# Patient Record
Sex: Female | Born: 1985 | Race: White | Hispanic: No | Marital: Married | Smoking: Never smoker
Health system: Southern US, Community
[De-identification: ages and names within clinical notes are randomized; demographics above are authoritative.]

## PROBLEM LIST (undated history)

## (undated) ENCOUNTER — Inpatient Hospital Stay (HOSPITAL_COMMUNITY): Payer: Self-pay

## (undated) DIAGNOSIS — H9190 Unspecified hearing loss, unspecified ear: Secondary | ICD-10-CM

## (undated) DIAGNOSIS — Z98891 History of uterine scar from previous surgery: Secondary | ICD-10-CM

## (undated) DIAGNOSIS — R630 Anorexia: Secondary | ICD-10-CM

## (undated) DIAGNOSIS — J45909 Unspecified asthma, uncomplicated: Secondary | ICD-10-CM

## (undated) DIAGNOSIS — R011 Cardiac murmur, unspecified: Secondary | ICD-10-CM

## (undated) DIAGNOSIS — Z8619 Personal history of other infectious and parasitic diseases: Secondary | ICD-10-CM

## (undated) DIAGNOSIS — F419 Anxiety disorder, unspecified: Secondary | ICD-10-CM

## (undated) DIAGNOSIS — R079 Chest pain, unspecified: Secondary | ICD-10-CM

## (undated) HISTORY — PX: TONSILLECTOMY: SUR1361

## (undated) HISTORY — PX: TYMPANOSTOMY TUBE PLACEMENT: SHX32

## (undated) HISTORY — DX: Personal history of other infectious and parasitic diseases: Z86.19

## (undated) HISTORY — DX: Chest pain, unspecified: R07.9

## (undated) HISTORY — DX: Unspecified hearing loss, unspecified ear: H91.90

## (undated) HISTORY — DX: Anxiety disorder, unspecified: F41.9

---
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
## 2006-04-27 ENCOUNTER — Encounter (INDEPENDENT_AMBULATORY_CARE_PROVIDER_SITE_OTHER): Payer: Self-pay | Admitting: Specialist

## 2006-04-27 ENCOUNTER — Ambulatory Visit (HOSPITAL_BASED_OUTPATIENT_CLINIC_OR_DEPARTMENT_OTHER): Admission: RE | Admit: 2006-04-27 | Discharge: 2006-04-28 | Payer: Self-pay | Admitting: Otolaryngology

## 2007-03-19 ENCOUNTER — Ambulatory Visit: Payer: Self-pay | Admitting: Orthopedic Surgery

## 2007-03-19 ENCOUNTER — Ambulatory Visit (HOSPITAL_COMMUNITY): Admission: RE | Admit: 2007-03-19 | Discharge: 2007-03-19 | Payer: Self-pay | Admitting: Internal Medicine

## 2007-03-30 DIAGNOSIS — J45909 Unspecified asthma, uncomplicated: Secondary | ICD-10-CM | POA: Insufficient documentation

## 2009-01-04 ENCOUNTER — Other Ambulatory Visit: Admission: RE | Admit: 2009-01-04 | Discharge: 2009-01-04 | Payer: Self-pay | Admitting: Obstetrics and Gynecology

## 2010-02-19 ENCOUNTER — Other Ambulatory Visit: Admission: RE | Admit: 2010-02-19 | Discharge: 2010-02-19 | Payer: Self-pay | Admitting: Obstetrics and Gynecology

## 2011-02-07 NOTE — Op Note (Signed)
[name], [name]            ACCOUNT NO.:  [id number]   MEDICAL RECORD NO.:  [id number]          PATIENT TYPE:  AMB   LOCATION:  DSC                          FACILITY:  [facility]   PHYSICIAN:  Karol T. Lazarus [name], M.D. DATE OF BIRTH:  [birth year]   DATE OF PROCEDURE:  04/27/2006  DATE OF DISCHARGE:                                 OPERATIVE REPORT   PREOPERATIVE DIAGNOSIS:  Chronic tonsillolith formation.   POSTOPERATIVE DIAGNOSIS:  Chronic tonsillolith formation.   PROCEDURE PERFORMED:  Tonsillectomy.   SURGEON:  Dr. Lazarus [name]   ANESTHESIA:  General orotracheal.   BLOOD LOSS:  Minimal.   COMPLICATIONS:  None.   FINDINGS:  One - 2+ imbedded tonsils with cryptic debris, right greater than  left.  Moderate fibrosis bilaterally.  Normal soft palate.  No residual  adenoids.  Moderate congestion of the anterior nose.   PROCEDURE:  With the patient in a comfortable supine position, general  orotracheal anesthesia was induced without difficulty.  At an appropriate  level, the table was turned 90 degrees, and the patient placed in  Trendelenburg.  A clean preparation and draping was accomplished.  Taking  care to protect lips, teeth, and endotracheal tube, the Crowe-Davis mouth  gag was introduced, expanded for visualization, and suspended from the Mayo  stand in the standard fashion.  The findings were as described above.  Palate retractor and mirror were used to visualize the nasopharynx with the  findings as described above.  The anterior nose was examined with the nasal  speculum with the findings as described above.  Xylocaine 0.5% with  1:200,000 epinephrine, 8 mL total was infiltrated into the peritonsillar  planes on both sides.  Several minutes were allowed for anesthesia and  hemostasis to take effect.   No adenoidectomy was felt indicated.   Beginning on the left side, the tonsil was grasped and retracted medially.  The mucosa overlying the anterior and superior poles was  coagulated and then  cut down to the capsule of the tonsil.  Using the cautery tip as a blunt  dissector, lysing fibrous bands, and coagulating crossing vessels, the  tonsil was dissected from its muscular fossa from inferiorly upward.  The  tonsil was removed in its entirety as determined by examination of both  tonsil and fossa.  There was moderate fibrosis.  A small additional quantity  of cautery rendered the fossa hemostatic.  After completing the left  tonsillectomy, the right side was done in identical fashion.  There was some  heavier tonsillith debris on this side, and fibrosis onto the posterior  tonsillar pillar with a slight asymmetry of the resection right greater than  left.  Again a small additional quantity of cautery rendered the fossa  hemostatic.   After removing both tonsils, the mouth gag was relaxed for several minutes.  Upon reexpansion, hemostasis was persistent.  At this point, the procedure  was completed.  The mouth gag was relaxed and removed.  The dental status  was intact.  The patient was returned to anesthesia, awakened, extubated,  and transferred to recovery in stable condition.   COMMENT:  A  25 year old white female with a history of recurrent tonsillith  formation, halitosis, foreign body sensation, and occasional sore throat was  the indication for today's procedure.  Anticipate a routine postoperative  recovery with attention to ice, elevation, analgesia, antibiosis, hydration,  and observation for bleeding, emesis, or airway compromise.  Given  geographic distance, will observe 23 hours extended recovery.      Sarah [name], M.D.  Electronically Signed     KTW/MEDQ  D:  04/27/2006  T:  04/27/2006  Job:  [id number]   cc:   Kingsley Callander. Ouida Sills, MD

## 2013-09-14 LAB — OB RESULTS CONSOLE GC/CHLAMYDIA
CHLAMYDIA, DNA PROBE: NEGATIVE
GC PROBE AMP, GENITAL: NEGATIVE

## 2013-09-14 LAB — OB RESULTS CONSOLE ANTIBODY SCREEN: ANTIBODY SCREEN: NEGATIVE

## 2013-09-14 LAB — OB RESULTS CONSOLE HIV ANTIBODY (ROUTINE TESTING): HIV: NONREACTIVE

## 2013-09-14 LAB — OB RESULTS CONSOLE RUBELLA ANTIBODY, IGM: RUBELLA: IMMUNE

## 2013-09-14 LAB — OB RESULTS CONSOLE ABO/RH: RH Type: POSITIVE

## 2013-09-14 LAB — OB RESULTS CONSOLE HEPATITIS B SURFACE ANTIGEN: HEP B S AG: NEGATIVE

## 2013-09-14 LAB — OB RESULTS CONSOLE RPR: RPR: NONREACTIVE

## 2014-03-20 LAB — OB RESULTS CONSOLE GBS: STREP GROUP B AG: NEGATIVE

## 2014-03-30 ENCOUNTER — Inpatient Hospital Stay (HOSPITAL_COMMUNITY): Payer: BC Managed Care – PPO | Admitting: Anesthesiology

## 2014-03-30 ENCOUNTER — Encounter (HOSPITAL_COMMUNITY): Payer: Self-pay | Admitting: *Deleted

## 2014-03-30 ENCOUNTER — Encounter (HOSPITAL_COMMUNITY): Payer: BC Managed Care – PPO | Admitting: Anesthesiology

## 2014-03-30 ENCOUNTER — Inpatient Hospital Stay (HOSPITAL_COMMUNITY)
Admission: AD | Admit: 2014-03-30 | Discharge: 2014-04-02 | DRG: 766 | Disposition: A | Discharge status: Home / Self Care | Payer: BC Managed Care – PPO | Source: Ambulatory Visit | Attending: Obstetrics and Gynecology | Admitting: Obstetrics and Gynecology

## 2014-03-30 ENCOUNTER — Encounter (HOSPITAL_COMMUNITY)
Admission: AD | Disposition: A | Discharge status: Home / Self Care | Payer: Self-pay | Source: Ambulatory Visit | Attending: Obstetrics and Gynecology

## 2014-03-30 DIAGNOSIS — O321XX0 Maternal care for breech presentation, not applicable or unspecified: Principal | ICD-10-CM | POA: Diagnosis not present

## 2014-03-30 DIAGNOSIS — IMO0001 Reserved for inherently not codable concepts without codable children: Secondary | ICD-10-CM

## 2014-03-30 DIAGNOSIS — J45909 Unspecified asthma, uncomplicated: Secondary | ICD-10-CM | POA: Diagnosis present

## 2014-03-30 DIAGNOSIS — O4290 Premature rupture of membranes, unspecified as to length of time between rupture and onset of labor, unspecified weeks of gestation: Secondary | ICD-10-CM | POA: Diagnosis present

## 2014-03-30 HISTORY — DX: Unspecified asthma, uncomplicated: J45.909

## 2014-03-30 LAB — CBC
HEMATOCRIT: 37.2 % (ref 36.0–46.0)
HEMOGLOBIN: 12.3 g/dL (ref 12.0–15.0)
MCH: 29.2 pg (ref 26.0–34.0)
MCHC: 33.1 g/dL (ref 30.0–36.0)
MCV: 88.4 fL (ref 78.0–100.0)
Platelets: 330 10*3/uL (ref 150–400)
RBC: 4.21 MIL/uL (ref 3.87–5.11)
RDW: 13.4 % (ref 11.5–15.5)
WBC: 9.9 10*3/uL (ref 4.0–10.5)

## 2014-03-30 LAB — RPR

## 2014-03-30 LAB — ABO/RH: ABO/RH(D): O POS

## 2014-03-30 SURGERY — Surgical Case
Anesthesia: Epidural

## 2014-03-30 MED ORDER — SCOPOLAMINE 1 MG/3DAYS TD PT72
1.0000 | MEDICATED_PATCH | Freq: Once | TRANSDERMAL | Status: AC
  Administered 2014-03-30: 1.5 mg via TRANSDERMAL

## 2014-03-30 MED ORDER — DIPHENHYDRAMINE HCL 25 MG PO CAPS
25.0000 mg | ORAL_CAPSULE | ORAL | Status: DC | PRN
  Filled 2014-03-30: qty 1

## 2014-03-30 MED ORDER — MENTHOL 3 MG MT LOZG: 1.0000 | LOZENGE | OROMUCOSAL | Status: DC | PRN

## 2014-03-30 MED ORDER — OXYTOCIN BOLUS FROM INFUSION: 500.0000 mL | INTRAVENOUS | Status: DC

## 2014-03-30 MED ORDER — DIPHENHYDRAMINE HCL 50 MG/ML IJ SOLN: 12.5000 mg | INTRAMUSCULAR | Status: DC | PRN

## 2014-03-30 MED ORDER — SIMETHICONE 80 MG PO CHEW
80.0000 mg | CHEWABLE_TABLET | Freq: Three times a day (TID) | ORAL | Status: DC
  Administered 2014-03-30 – 2014-04-02 (×9): 80 mg via ORAL
  Filled 2014-03-30 (×8): qty 1

## 2014-03-30 MED ORDER — LACTATED RINGERS IV SOLN
INTRAVENOUS | Status: DC
  Administered 2014-03-30: 05:00:00 via INTRAVENOUS

## 2014-03-30 MED ORDER — ONDANSETRON HCL 4 MG/2ML IJ SOLN: 4.0000 mg | Freq: Four times a day (QID) | INTRAMUSCULAR | Status: DC | PRN

## 2014-03-30 MED ORDER — PHENYLEPHRINE 40 MCG/ML (10ML) SYRINGE FOR IV PUSH (FOR BLOOD PRESSURE SUPPORT)
PREFILLED_SYRINGE | INTRAVENOUS | Status: AC
  Filled 2014-03-30: qty 15

## 2014-03-30 MED ORDER — IBUPROFEN 600 MG PO TABS: 600.0000 mg | ORAL_TABLET | Freq: Four times a day (QID) | ORAL | Status: DC | PRN

## 2014-03-30 MED ORDER — TETANUS-DIPHTH-ACELL PERTUSSIS 5-2.5-18.5 LF-MCG/0.5 IM SUSP: 0.5000 mL | Freq: Once | INTRAMUSCULAR | Status: DC

## 2014-03-30 MED ORDER — ACETAMINOPHEN 325 MG PO TABS: 650.0000 mg | ORAL_TABLET | ORAL | Status: DC | PRN

## 2014-03-30 MED ORDER — LIDOCAINE-EPINEPHRINE (PF) 2 %-1:200000 IJ SOLN
INTRAMUSCULAR | Status: AC
  Filled 2014-03-30: qty 20

## 2014-03-30 MED ORDER — SCOPOLAMINE 1 MG/3DAYS TD PT72
MEDICATED_PATCH | TRANSDERMAL | Status: AC
  Administered 2014-03-30: 1.5 mg via TRANSDERMAL
  Filled 2014-03-30: qty 1

## 2014-03-30 MED ORDER — CITRIC ACID-SODIUM CITRATE 334-500 MG/5ML PO SOLN
30.0000 mL | ORAL | Status: DC | PRN
  Administered 2014-03-30: 30 mL via ORAL
  Filled 2014-03-30: qty 15

## 2014-03-30 MED ORDER — SODIUM BICARBONATE 8.4 % IV SOLN
INTRAVENOUS | Status: AC
  Filled 2014-03-30: qty 50

## 2014-03-30 MED ORDER — MAGNESIUM HYDROXIDE 400 MG/5ML PO SUSP: 30.0000 mL | ORAL | Status: DC | PRN

## 2014-03-30 MED ORDER — KETOROLAC TROMETHAMINE 30 MG/ML IJ SOLN
INTRAMUSCULAR | Status: AC
  Administered 2014-03-30: 30 mg via INTRAMUSCULAR
  Filled 2014-03-30: qty 1

## 2014-03-30 MED ORDER — PRENATAL MULTIVITAMIN CH
1.0000 | ORAL_TABLET | Freq: Every day | ORAL | Status: DC
  Administered 2014-04-01: 1 via ORAL
  Filled 2014-03-30 (×4): qty 1

## 2014-03-30 MED ORDER — FENTANYL 2.5 MCG/ML BUPIVACAINE 1/10 % EPIDURAL INFUSION (WH - ANES)
14.0000 mL/h | INTRAMUSCULAR | Status: DC | PRN
  Administered 2014-03-30: 14 mL/h via EPIDURAL
  Filled 2014-03-30: qty 125

## 2014-03-30 MED ORDER — NALOXONE HCL 1 MG/ML IJ SOLN
1.0000 ug/kg/h | INTRAVENOUS | Status: DC | PRN
  Filled 2014-03-30: qty 2

## 2014-03-30 MED ORDER — LACTATED RINGERS IV SOLN
500.0000 mL | Freq: Once | INTRAVENOUS | Status: AC
  Administered 2014-03-30: 500 mL via INTRAVENOUS

## 2014-03-30 MED ORDER — OXYCODONE-ACETAMINOPHEN 5-325 MG PO TABS
1.0000 | ORAL_TABLET | ORAL | Status: DC | PRN
  Administered 2014-03-31 – 2014-04-02 (×9): 1 via ORAL
  Filled 2014-03-30 (×10): qty 1

## 2014-03-30 MED ORDER — SENNOSIDES-DOCUSATE SODIUM 8.6-50 MG PO TABS
2.0000 | ORAL_TABLET | ORAL | Status: DC
  Administered 2014-03-31 – 2014-04-01 (×3): 2 via ORAL
  Filled 2014-03-30 (×3): qty 2

## 2014-03-30 MED ORDER — EPHEDRINE 5 MG/ML INJ: 10.0000 mg | INTRAVENOUS | Status: DC | PRN

## 2014-03-30 MED ORDER — ONDANSETRON HCL 4 MG/2ML IJ SOLN: 4.0000 mg | Freq: Three times a day (TID) | INTRAMUSCULAR | Status: DC | PRN

## 2014-03-30 MED ORDER — DIBUCAINE 1 % RE OINT: 1.0000 "application " | TOPICAL_OINTMENT | RECTAL | Status: DC | PRN

## 2014-03-30 MED ORDER — KETOROLAC TROMETHAMINE 30 MG/ML IJ SOLN
30.0000 mg | Freq: Four times a day (QID) | INTRAMUSCULAR | Status: AC | PRN
  Administered 2014-03-30: 30 mg via INTRAMUSCULAR

## 2014-03-30 MED ORDER — DIPHENHYDRAMINE HCL 50 MG/ML IJ SOLN: 25.0000 mg | INTRAMUSCULAR | Status: DC | PRN

## 2014-03-30 MED ORDER — ONDANSETRON HCL 4 MG PO TABS: 4.0000 mg | ORAL_TABLET | ORAL | Status: DC | PRN

## 2014-03-30 MED ORDER — LACTATED RINGERS IV SOLN
INTRAVENOUS | Status: DC | PRN
  Administered 2014-03-30 (×2): via INTRAVENOUS

## 2014-03-30 MED ORDER — LIDOCAINE HCL (PF) 1 % IJ SOLN: 30.0000 mL | INTRAMUSCULAR | Status: DC | PRN

## 2014-03-30 MED ORDER — SODIUM CHLORIDE 0.9 % IJ SOLN: 3.0000 mL | INTRAMUSCULAR | Status: DC | PRN

## 2014-03-30 MED ORDER — ALBUTEROL SULFATE (2.5 MG/3ML) 0.083% IN NEBU: 3.0000 mL | INHALATION_SOLUTION | Freq: Four times a day (QID) | RESPIRATORY_TRACT | Status: DC | PRN

## 2014-03-30 MED ORDER — MORPHINE SULFATE (PF) 0.5 MG/ML IJ SOLN
INTRAMUSCULAR | Status: DC | PRN
  Administered 2014-03-30: 3 mg via EPIDURAL

## 2014-03-30 MED ORDER — WITCH HAZEL-GLYCERIN EX PADS: 1.0000 "application " | MEDICATED_PAD | CUTANEOUS | Status: DC | PRN

## 2014-03-30 MED ORDER — OXYTOCIN 40 UNITS IN LACTATED RINGERS INFUSION - SIMPLE MED
62.5000 mL/h | INTRAVENOUS | Status: DC
Start: 2014-03-30 — End: 2014-03-30

## 2014-03-30 MED ORDER — DIPHENHYDRAMINE HCL 25 MG PO CAPS: 25.0000 mg | ORAL_CAPSULE | Freq: Four times a day (QID) | ORAL | Status: DC | PRN

## 2014-03-30 MED ORDER — NALBUPHINE HCL 10 MG/ML IJ SOLN
5.0000 mg | INTRAMUSCULAR | Status: DC | PRN
  Administered 2014-03-30 – 2014-03-31 (×3): 10 mg via SUBCUTANEOUS
  Filled 2014-03-30 (×3): qty 1

## 2014-03-30 MED ORDER — SODIUM BICARBONATE 8.4 % IV SOLN
INTRAVENOUS | Status: DC | PRN
  Administered 2014-03-30 (×4): 5 mL via EPIDURAL

## 2014-03-30 MED ORDER — ONDANSETRON HCL 4 MG/2ML IJ SOLN
INTRAMUSCULAR | Status: AC
  Filled 2014-03-30: qty 4

## 2014-03-30 MED ORDER — OXYTOCIN 40 UNITS IN LACTATED RINGERS INFUSION - SIMPLE MED
1.0000 m[IU]/min | INTRAVENOUS | Status: DC
  Administered 2014-03-30: 6 m[IU]/min via INTRAVENOUS
  Administered 2014-03-30: 2 m[IU]/min via INTRAVENOUS
  Filled 2014-03-30: qty 1000

## 2014-03-30 MED ORDER — ONDANSETRON HCL 4 MG/2ML IJ SOLN: 4.0000 mg | INTRAMUSCULAR | Status: DC | PRN

## 2014-03-30 MED ORDER — PHENYLEPHRINE 40 MCG/ML (10ML) SYRINGE FOR IV PUSH (FOR BLOOD PRESSURE SUPPORT): 80.0000 ug | PREFILLED_SYRINGE | INTRAVENOUS | Status: DC | PRN

## 2014-03-30 MED ORDER — OXYTOCIN 40 UNITS IN LACTATED RINGERS INFUSION - SIMPLE MED: 62.5000 mL/h | INTRAVENOUS | Status: AC

## 2014-03-30 MED ORDER — LACTATED RINGERS IV SOLN: 500.0000 mL | INTRAVENOUS | Status: DC | PRN

## 2014-03-30 MED ORDER — LANOLIN HYDROUS EX OINT: 1.0000 "application " | TOPICAL_OINTMENT | CUTANEOUS | Status: DC | PRN

## 2014-03-30 MED ORDER — PHENYLEPHRINE 40 MCG/ML (10ML) SYRINGE FOR IV PUSH (FOR BLOOD PRESSURE SUPPORT)
80.0000 ug | PREFILLED_SYRINGE | INTRAVENOUS | Status: AC | PRN
  Administered 2014-03-30: 80 ug via INTRAVENOUS
  Administered 2014-03-30: 40 ug via INTRAVENOUS
  Administered 2014-03-30 (×3): 80 ug via INTRAVENOUS
  Administered 2014-03-30 (×3): 40 ug via INTRAVENOUS
  Filled 2014-03-30: qty 10

## 2014-03-30 MED ORDER — TERBUTALINE SULFATE 1 MG/ML IJ SOLN: 0.2500 mg | Freq: Once | INTRAMUSCULAR | Status: DC | PRN

## 2014-03-30 MED ORDER — METOCLOPRAMIDE HCL 5 MG/ML IJ SOLN: 10.0000 mg | Freq: Three times a day (TID) | INTRAMUSCULAR | Status: DC | PRN

## 2014-03-30 MED ORDER — LACTATED RINGERS IV SOLN
INTRAVENOUS | Status: DC
  Administered 2014-03-31: via INTRAVENOUS

## 2014-03-30 MED ORDER — NALBUPHINE HCL 10 MG/ML IJ SOLN
5.0000 mg | INTRAMUSCULAR | Status: DC | PRN
Start: 2014-03-30 — End: 2014-04-02

## 2014-03-30 MED ORDER — FLEET ENEMA 7-19 GM/118ML RE ENEM: 1.0000 | ENEMA | RECTAL | Status: DC | PRN

## 2014-03-30 MED ORDER — MEPERIDINE HCL 25 MG/ML IJ SOLN
6.2500 mg | INTRAMUSCULAR | Status: DC | PRN
Start: 2014-03-30 — End: 2014-03-30

## 2014-03-30 MED ORDER — OXYCODONE-ACETAMINOPHEN 5-325 MG PO TABS: 1.0000 | ORAL_TABLET | ORAL | Status: DC | PRN

## 2014-03-30 MED ORDER — PROMETHAZINE HCL 25 MG/ML IJ SOLN
6.2500 mg | INTRAMUSCULAR | Status: DC | PRN
Start: 2014-03-30 — End: 2014-03-30

## 2014-03-30 MED ORDER — IBUPROFEN 600 MG PO TABS
600.0000 mg | ORAL_TABLET | Freq: Four times a day (QID) | ORAL | Status: DC
  Administered 2014-03-31 – 2014-04-02 (×11): 600 mg via ORAL
  Filled 2014-03-30 (×11): qty 1

## 2014-03-30 MED ORDER — MEASLES, MUMPS & RUBELLA VAC ~~LOC~~ INJ
0.5000 mL | INJECTION | Freq: Once | SUBCUTANEOUS | Status: DC
  Filled 2014-03-30: qty 0.5

## 2014-03-30 MED ORDER — ZOLPIDEM TARTRATE 5 MG PO TABS: 5.0000 mg | ORAL_TABLET | Freq: Every evening | ORAL | Status: DC | PRN

## 2014-03-30 MED ORDER — LACTATED RINGERS IV SOLN
INTRAVENOUS | Status: DC | PRN
  Administered 2014-03-30: 10:00:00 via INTRAVENOUS

## 2014-03-30 MED ORDER — SIMETHICONE 80 MG PO CHEW
80.0000 mg | CHEWABLE_TABLET | ORAL | Status: DC
  Administered 2014-03-31 – 2014-04-01 (×3): 80 mg via ORAL
  Filled 2014-03-30 (×4): qty 1

## 2014-03-30 MED ORDER — LIDOCAINE HCL (PF) 1 % IJ SOLN
INTRAMUSCULAR | Status: DC | PRN
  Administered 2014-03-30 (×4): 4 mL

## 2014-03-30 MED ORDER — MORPHINE SULFATE 0.5 MG/ML IJ SOLN
INTRAMUSCULAR | Status: AC
  Filled 2014-03-30: qty 10

## 2014-03-30 MED ORDER — NALOXONE HCL 0.4 MG/ML IJ SOLN: 0.4000 mg | INTRAMUSCULAR | Status: DC | PRN

## 2014-03-30 MED ORDER — ONDANSETRON HCL 4 MG/2ML IJ SOLN
INTRAMUSCULAR | Status: DC | PRN
  Administered 2014-03-30: 4 mg via INTRAVENOUS

## 2014-03-30 MED ORDER — KETOROLAC TROMETHAMINE 30 MG/ML IJ SOLN: 15.0000 mg | Freq: Once | INTRAMUSCULAR | Status: DC | PRN

## 2014-03-30 MED ORDER — MEPERIDINE HCL 25 MG/ML IJ SOLN: 6.2500 mg | INTRAMUSCULAR | Status: DC | PRN

## 2014-03-30 MED ORDER — OXYTOCIN 10 UNIT/ML IJ SOLN
INTRAMUSCULAR | Status: AC
  Filled 2014-03-30: qty 4

## 2014-03-30 MED ORDER — OXYTOCIN 10 UNIT/ML IJ SOLN
40.0000 [IU] | INTRAVENOUS | Status: DC | PRN
  Administered 2014-03-30: 40 [IU] via INTRAVENOUS

## 2014-03-30 MED ORDER — SIMETHICONE 80 MG PO CHEW: 80.0000 mg | CHEWABLE_TABLET | ORAL | Status: DC | PRN

## 2014-03-30 MED ORDER — KETOROLAC TROMETHAMINE 30 MG/ML IJ SOLN
30.0000 mg | Freq: Four times a day (QID) | INTRAMUSCULAR | Status: AC | PRN
  Administered 2014-03-30: 30 mg via INTRAVENOUS
  Filled 2014-03-30: qty 1

## 2014-03-30 MED ORDER — CEFAZOLIN SODIUM-DEXTROSE 2-3 GM-% IV SOLR
INTRAVENOUS | Status: AC
  Filled 2014-03-30: qty 50

## 2014-03-30 MED ORDER — HYDROMORPHONE HCL PF 1 MG/ML IJ SOLN
0.2500 mg | INTRAMUSCULAR | Status: DC | PRN
Start: 2014-03-30 — End: 2014-03-30

## 2014-03-30 MED ORDER — CEFAZOLIN SODIUM-DEXTROSE 2-3 GM-% IV SOLR
INTRAVENOUS | Status: DC | PRN
  Administered 2014-03-30: 2 g via INTRAVENOUS

## 2014-03-30 MED ORDER — SODIUM BICARBONATE 8.4 % IV SOLN: INTRAVENOUS | Status: DC | PRN

## 2014-03-30 SURGICAL SUPPLY — 41 items
BENZOIN TINCTURE PRP APPL 2/3 (GAUZE/BANDAGES/DRESSINGS) ×3 IMPLANT
CLAMP CORD UMBIL (MISCELLANEOUS) IMPLANT
CLOSURE WOUND 1/2 X4 (GAUZE/BANDAGES/DRESSINGS) ×1
CLOSURE WOUND 1/4X4 (GAUZE/BANDAGES/DRESSINGS) ×1
CLOTH BEACON ORANGE TIMEOUT ST (SAFETY) ×3 IMPLANT
CONTAINER PREFILL 10% NBF 15ML (MISCELLANEOUS) IMPLANT
DRAPE LG THREE QUARTER DISP (DRAPES) IMPLANT
DRSG OPSITE POSTOP 4X10 (GAUZE/BANDAGES/DRESSINGS) ×3 IMPLANT
DURAPREP 26ML APPLICATOR (WOUND CARE) ×3 IMPLANT
ELECT REM PT RETURN 9FT ADLT (ELECTROSURGICAL) ×3
ELECTRODE REM PT RTRN 9FT ADLT (ELECTROSURGICAL) ×1 IMPLANT
EXTRACTOR VACUUM KIWI (MISCELLANEOUS) IMPLANT
EXTRACTOR VACUUM M CUP 4 TUBE (SUCTIONS) IMPLANT
EXTRACTOR VACUUM M CUP 4' TUBE (SUCTIONS)
GLOVE BIO SURGEON STRL SZ8 (GLOVE) ×6 IMPLANT
GLOVE ORTHO TXT STRL SZ7.5 (GLOVE) ×3 IMPLANT
GOWN STRL REUS W/TWL LRG LVL3 (GOWN DISPOSABLE) ×6 IMPLANT
KIT ABG SYR 3ML LUER SLIP (SYRINGE) IMPLANT
NEEDLE HYPO 25X5/8 SAFETYGLIDE (NEEDLE) ×3 IMPLANT
NS IRRIG 1000ML POUR BTL (IV SOLUTION) ×3 IMPLANT
PACK C SECTION WH (CUSTOM PROCEDURE TRAY) ×3 IMPLANT
PAD OB MATERNITY 4.3X12.25 (PERSONAL CARE ITEMS) ×3 IMPLANT
RETRACTOR WND ALEXIS 25 LRG (MISCELLANEOUS) ×1 IMPLANT
RTRCTR C-SECT PINK 25CM LRG (MISCELLANEOUS) ×3 IMPLANT
RTRCTR WOUND ALEXIS 25CM LRG (MISCELLANEOUS) ×3
STAPLER VISISTAT 35W (STAPLE) IMPLANT
STRIP CLOSURE SKIN 1/2X4 (GAUZE/BANDAGES/DRESSINGS) ×2 IMPLANT
STRIP CLOSURE SKIN 1/4X4 (GAUZE/BANDAGES/DRESSINGS) ×2 IMPLANT
SUT CHROMIC 1 CTX 36 (SUTURE) ×9 IMPLANT
SUT PLAIN 0 NONE (SUTURE) IMPLANT
SUT PLAIN 2 0 (SUTURE) ×2
SUT PLAIN 2 0 XLH (SUTURE) IMPLANT
SUT PLAIN ABS 2-0 CT1 27XMFL (SUTURE) ×1 IMPLANT
SUT VIC AB 0 CT1 27 (SUTURE) ×4
SUT VIC AB 0 CT1 27XBRD ANBCTR (SUTURE) ×2 IMPLANT
SUT VIC AB 2-0 CT1 27 (SUTURE) ×2
SUT VIC AB 2-0 CT1 TAPERPNT 27 (SUTURE) ×1 IMPLANT
SUT VIC AB 4-0 KS 27 (SUTURE) ×3 IMPLANT
TOWEL OR 17X24 6PK STRL BLUE (TOWEL DISPOSABLE) ×3 IMPLANT
TRAY FOLEY CATH 14FR (SET/KITS/TRAYS/PACK) ×3 IMPLANT
WATER STERILE IRR 1000ML POUR (IV SOLUTION) ×3 IMPLANT

## 2014-03-30 NOTE — H&P (Signed)
NAMMable Fill:  Huhta, Cadey                ACCOUNT NO.:  [id number]  MEDICAL RECORD NO.:  [id number]  LOCATION:  9168                          FACILITY:  [facility]  PHYSICIAN:  Malachi Prohomas F. Ambrose MantleHenley, M.D. DATE OF BIRTH:  November 27, 1985  DATE OF ADMISSION:  03/30/2014 DATE OF DISCHARGE:                             HISTORY & PHYSICAL   HISTORY OF PRESENT ILLNESS:  This is a 28 year old white female, para 0, gravida 1, EDC April 13, 2014, who is admitted with premature rupture of the membranes since 12:55 a.m. on March 30, 2014.  Blood group and type is O positive with a negative antibody, RPR negative, urine culture negative, hepatitis B surface antigen negative, HIV negative, GC and Chlamydia negative, rubella immune.  Pap smear normal.  Cystic fibrosis negative.  First trimester screen negative.  AFP negative.  One-hour Glucola 166, 3-hour GTT 71, 39, 136, and 115.  Repeat HIV and RPR negative.  Group B strep negative.  This patient began her prenatal care early in pregnancy.  Her [facility] June 01, 2013[facility] March 18, 2014, by last period.  [facility] August 16, 2013, 5 weeks 5 days[facility]  Ultrasound August 30, 2014, at 8 weeks and 1 day [facility] April 10, 2014.  Final EDC was April 13, 2014.  She was seen initially at 9 weeks and 6 days.  She initially had difficulty with nausea that was helped with Diclegis.  The patient was seen on March 20, 2014, after an uncomplicated prenatal course, and the cervix was 0 cm dilated. At 12:55 a.m. today, she had spontaneous rupture of membranes, she came to the hospital, ruptured membranes was confirmed, and she was admitted. She has had spontaneous contractions on her own.  She claims that the contractions are rated at 7/10.  PAST MEDICAL HISTORY:  Right ear partial hearing loss.  She has had fractures of her elbow, fingers, and toes.  She has a history of bad knees from softball or spraying tip and pinched nerve in her sciatic nerve in 2013.  She  has a history of exercise-induced asthma.  SURGICAL HISTORY:  T and A at age 28, myringotomy at age 28 and 6520.  ALLERGIES:  PENICILLIN and rash from LATEX.  SOCIAL HISTORY:  She never smoked, drank occasionally prior to pregnancy.  Has post graduate education in [employer] from [employer].  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  She exercises regularly.  FAMILY HISTORY:  Father has high blood pressure, disorder of the thyroid gland, and skin cancer.  Sister had skin cancer as a teen from 1 mold. Paternal uncle has myocardial infarction at age 28.  Paternal grandmother thyroid dysfunction.  Paternal aunt thyroid dysfunction.  PHYSICAL EXAMINATION:  GENERAL:  On admission reveals a well-developed, well-nourished white female, claiming contractions, pain at 7/10. VITAL SIGNS:  Blood pressure is 115/76, pulse is 75, temperature 98, respirations 20. HEART:  Normal size and sounds.  No murmurs. LUNGS:  Clear to auscultation. ABDOMEN:  Soft.  At her last prenatal visit, the fundal height measured 36 cm per the RN in the delivery room.  Her cervix is 2 cm, 80% effaced, vertex at -2.  ADMITTING IMPRESSION:  Intrauterine pregnancy at 38 weeks and 0 days with premature rupture of the membranes.  The patient is admitted.  She requested an epidural, which has been requested, and if progress of labor does not ensue, Pitocin will be started.     Malachi Pro. Ambrose Mantle, M.D.     TFH/MEDQ  D:  03/30/2014  T:  03/30/2014  Job:  [id number]

## 2014-03-30 NOTE — Transfer of Care (Signed)
Immediate Anesthesia Transfer of Care Note  Patient: Sarah [name]  Procedure(s) Performed: Procedure(s): CESAREAN SECTION (N/A)  Patient Location: PACU  Anesthesia Type:Epidural  Level of Consciousness: awake, alert  and oriented  Airway & Oxygen Therapy: Patient Spontanous Breathing  Post-op Assessment: Report given to PACU RN and Post -op Vital signs reviewed and stable  Post vital signs: Reviewed and stable  Complications: No apparent anesthesia complications

## 2014-03-30 NOTE — Progress Notes (Signed)
Comfortable with epidural Afeb, VSS FHT- Cat I VE-6/80/0, breech Breech confirmed by u/s Discussed breech presentation, recommended c-section.  Discussed procedure and risks.  Will proceed as soon as OR is ready.

## 2014-03-30 NOTE — Anesthesia Postprocedure Evaluation (Signed)
Anesthesia Post Note  Patient: Sarah [name]  Procedure(s) Performed: Procedure(s) (LRB): CESAREAN SECTION (N/A)  Anesthesia type: Epidural  Patient location: PACU  Post pain: Pain level controlled  Post assessment: Post-op Vital signs reviewed  Last Vitals:  Filed Vitals:   03/30/14 1100  BP: 111/65  Pulse: 82  Temp:   Resp: 23    Post vital signs: Reviewed  Level of consciousness: awake  Complications: No apparent anesthesia complications

## 2014-03-30 NOTE — MAU Note (Signed)
Pt states she has been contractions at 2355 pt states she  Started having contractions started at 0107. Pt states she is leaking a lot

## 2014-03-30 NOTE — Anesthesia Procedure Notes (Signed)
Epidural Patient location during procedure: OB Start time: 03/30/2014 5:07 AM  Staffing Performed by: anesthesiologist   Preanesthetic Checklist Completed: patient identified, site marked, surgical consent, pre-op evaluation, timeout performed, IV checked, risks and benefits discussed and monitors and equipment checked  Epidural Patient position: sitting Prep: site prepped and draped and DuraPrep Patient monitoring: continuous pulse ox and blood pressure Approach: midline Injection technique: LOR air  Needle:  Needle type: Tuohy  Needle gauge: 17 G Needle length: 9 cm and 9 Needle insertion depth: 4.5 cm Catheter type: closed end flexible Catheter size: 19 Gauge Catheter at skin depth: 9.5 cm Test dose: negative  Assessment Events: blood not aspirated, injection not painful, no injection resistance, negative IV test and no paresthesia  Additional Notes Discussed risk of headache, infection, bleeding, nerve injury and failed or incomplete block.  Patient voices understanding and wishes to proceed.  Epidural placed easily on first attempt. No paresthesia.  Patient tolerated procedure well with no apparent complications.  Jasmine DecemberA. Janos Shampine, MDReason for block:procedure for pain

## 2014-03-30 NOTE — Anesthesia Preprocedure Evaluation (Addendum)
Anesthesia Evaluation  Patient identified by MRN, date of birth, ID band Patient awake    Reviewed: Allergy & Precautions, H&P , NPO status , Patient's Chart, lab work & pertinent test results, reviewed documented beta blocker date and time   History of Anesthesia Complications Negative for: history of anesthetic complications  Airway Mallampati: I TM Distance: >3 FB Neck ROM: full    Dental  (+) Teeth Intact   Pulmonary asthma (last inhaler use last month) ,  breath sounds clear to auscultation        Cardiovascular negative cardio ROS  Rhythm:regular Rate:Normal     Neuro/Psych negative neurological ROS  negative psych ROS   GI/Hepatic negative GI ROS, Neg liver ROS,   Endo/Other  negative endocrine ROS  Renal/GU negative Renal ROS     Musculoskeletal   Abdominal   Peds  Hematology negative hematology ROS (+)   Anesthesia Other Findings   Reproductive/Obstetrics (+) Pregnancy                           Anesthesia Physical Anesthesia Plan  ASA: II  Anesthesia Plan: Epidural   Post-op Pain Management:    Induction:   Airway Management Planned:   Additional Equipment:   Intra-op Plan:   Post-operative Plan:   Informed Consent: I have reviewed the patients History and Physical, chart, labs and discussed the procedure including the risks, benefits and alternatives for the proposed anesthesia with the patient or authorized representative who has indicated his/her understanding and acceptance.     Plan Discussed with:   Anesthesia Plan Comments:         Anesthesia Quick Evaluation

## 2014-03-30 NOTE — Op Note (Signed)
Preoperative diagnosis: Intrauterine pregnancy at 398weeks, breech presentation, active labor Postoperative diagnosis: Same Procedure: Primary low transverse cesarean section without extensions Surgeon: Lavina Hammanodd Spiros Greenfeld M.D. Anesthesia: Epidural  Findings: Patient had normal gravid anatomy and delivered a viable female infant from the frank breech presentation with Apgars of 9 and 9 weight pending Estimated blood loss: 800 cc Specimens: Placenta sent to labor and delivery Complications: None  Procedure in detail: The patient was taken to the operating room and placed in the dorsosupine position with left tilt. Her previously placed epidural was dosed appropriately.  Abdomen was then prepped and draped in the usual sterile fashion, a foley catheter had previously been inserted. The level of her anesthesia was found to be adequate. Abdomen was entered via a standard Pfannenstiel incision. Once the peritoneal cavity was entered the Alexis disposable self-retaining retractor was placed and good visualization was achieved. A 4 cm transverse incision was then made in the lower uterine segment pushing the bladder inferior. Once the uterine cavity was entered the incision was extended digitally. The fetal vertex was grasped and delivered through the incision atraumatically. Mouth and nares were suctioned. The remainder of the infant then delivered atraumatically. Cord was doubly clamped and cut and the infant handed to the awaiting pediatric team. Cord blood was obtained. The placenta delivered spontaneously. Uterus was wiped dry with clean lap pad and all clots and debris were removed. Uterine incision was inspected and found to be free of extensions. Uterine incision was closed in 2 layers with running locking #1 Chromic for the first layer, imbricating #2 Chromic for the second layer. Tubes and ovaries were inspected and found to be normal. Uterine incision was inspected and found to be hemostatic, except for  some bleeding in the middle which was controlled with 2 figure 8 sutures of #1 Chromic. Bleeding from serosal edges was controlled with electrocautery. The Alexis retractor was removed. Subfascial space was irrigated and made hemostatic with electrocautery. Peritoneum was closed with running 2-0 Vicryl.  Fascia was closed in running fashion starting at both ends and meeting in the middle with 0 Vicryl. Subcutaneous tissue was then irrigated and made hemostatic with electrocautery, then closed with running 2-0 plain gut. Skin was closed with running 4-0 Vicryl subcuticular suture followed by steri-strips and a sterile dressing. Patient tolerated the procedure well and was taken to the recovery in stable condition. Counts were correct x2, she received Ancef 2 g IV at the beginning of the procedure and she had PAS hose on throughout the procedure.

## 2014-03-30 NOTE — Progress Notes (Addendum)
Patient ID: Sarah [name], female   DOB: 12/04/1985, 28 y.o.   MRN: [id number] Pt is admitted with PROM at 12:55 AM ROM confirmed in MAU and pt rates contractions at 7/10. She requests an epidural. FHR is normal Prenatal course uneventful

## 2014-03-31 ENCOUNTER — Encounter (HOSPITAL_COMMUNITY): Payer: Self-pay | Admitting: Obstetrics and Gynecology

## 2014-03-31 LAB — CBC
HCT: 30.6 % — ABNORMAL LOW (ref 36.0–46.0)
HEMOGLOBIN: 9.9 g/dL — AB (ref 12.0–15.0)
MCH: 28.9 pg (ref 26.0–34.0)
MCHC: 32.4 g/dL (ref 30.0–36.0)
MCV: 89.5 fL (ref 78.0–100.0)
Platelets: 280 10*3/uL (ref 150–400)
RBC: 3.42 MIL/uL — ABNORMAL LOW (ref 3.87–5.11)
RDW: 13.7 % (ref 11.5–15.5)
WBC: 12.8 10*3/uL — ABNORMAL HIGH (ref 4.0–10.5)

## 2014-03-31 LAB — BIRTH TISSUE RECOVERY COLLECTION (PLACENTA DONATION)

## 2014-03-31 NOTE — Lactation Note (Signed)
This note was copied from the chart of Sarah [name]. Lactation Consultation Note  Initial consult:  P1, Support of sister Margie BilletSally RN in room and grandparents. Reviewed basics, Mom made aware of O/P services, breastfeeding support groups, community resources, and our phone # for post-discharge questions.  Mom encouraged to feed baby 8-12 times/24 hours and with feeding cues.  Mother was able to hand express good flow of colostrum.  Teach back done. Mother placed baby in cross cradle.  Baby latched easily.  Rhythmical sucks and swallows observed, some with stimulation. LS9. Mother denies soreness, although nipples are pink.  Reviewed applying ebm and provided comfort gels for future use. Reviewed fb position and encouraged watching channel 11. Encouraged mother to call if further assistance is needed.     Patient Name: Sarah Mable Fillmily Floyd ZOXWR'UToday's Date: 03/31/2014 Reason for consult: Follow-up assessment;Initial assessment   Maternal Data Formula Feeding for Exclusion: No Has patient been taught Hand Expression?: Yes  Feeding    LATCH Score/Interventions                      Lactation Tools Discussed/Used     Consult Status Consult Status: Follow-up Date: 04/01/14 Follow-up type: In-patient    Dahlia ByesBerkelhammer, Amadeo Coke [facility] 03/31/2014, 11:05 AM

## 2014-03-31 NOTE — Addendum Note (Signed)
Addendum created 03/31/14 0801 by Lincoln BrighamAngela Draughon Anshi Jalloh, CRNA   Modules edited: Notes Section   Notes Section:  File: [id number]

## 2014-03-31 NOTE — Progress Notes (Signed)
Subjective: Postpartum Day #1: Cesarean Delivery Patient reports incisional pain, tolerating PO and no problems voiding.    Objective: Vital signs in last 24 hours: Temp:  [98.1 F (36.7 C)-98.6 F (37 C)] 98.3 F (36.8 C) (07/10 0600) Pulse Rate:  [61-93] 61 (07/10 0600) Resp:  [14-26] 18 (07/10 0600) BP: (103-122)/(50-90) 112/68 mmHg (07/10 0600) SpO2:  [95 %-99 %] 96 % (07/10 0415)  Physical Exam:  General: alert Lochia: appropriate Uterine Fundus: firm Incision: dressing C/D/I   Recent Labs  03/30/14 0315 03/31/14 0550  HGB 12.3 9.9*  HCT 37.2 30.6*    Assessment/Plan: Status post Cesarean section. Doing well postoperatively.  Continue current care, ambulate.  Brent Taillon D 03/31/2014, 8:37 AM

## 2014-03-31 NOTE — Anesthesia Postprocedure Evaluation (Signed)
Anesthesia Post Note  Patient: Sarah [name]  Procedure(s) Performed: Procedure(s) (LRB): CESAREAN SECTION (N/A)  Anesthesia type: Epidural  Patient location: Mother/Baby  Post pain: Pain level controlled  Post assessment: Post-op Vital signs reviewed  Last Vitals:  Filed Vitals:   03/31/14 0600  BP: 112/68  Pulse: 61  Temp: 36.8 C  Resp: 18    Post vital signs: Reviewed  Level of consciousness:alert  Complications: No apparent anesthesia complications

## 2014-04-01 NOTE — Lactation Note (Signed)
This note was copied from the chart of Sarah [name]. Lactation Consultation Note Noted earlier I went in and heard a popping sound w/every suck. BF on breast. Mom has small nipples and bouncy areolas. Compresses well. Easily expressed hand colostrum demonstrated. Asked mom to call for next feeding earlier this morning. Went back when called. Assess baby's oral cavity, tongue curves w/limited movement, tongue will not go pass gum.  Noted upper labial frenulum to gum line. Hand pump given to mom, encouraged to pre-pump. Fitted #16 NS to obtain a deeper latch. Stated it felt good w/o pain. Shells given encouraged to wear in bra, CG given d/t slight bruising and tenderness. A lot of teaching on positioning, breast massage, and relaxation during feeding. Patient Name: Sarah Mable Fillmily Caterino JYNWG'NToday's Date: 04/01/2014 Reason for consult: Follow-up assessment   Maternal Data    Feeding Feeding Type: Breast Fed Length of feed: 20 min (still feeding)  LATCH Score/Interventions Latch: Repeated attempts needed to sustain latch, nipple held in mouth throughout feeding, stimulation needed to elicit sucking reflex. Intervention(s): Adjust position;Assist with latch;Breast massage;Breast compression  Audible Swallowing: Spontaneous and intermittent Intervention(s): Skin to skin;Hand expression Intervention(s): Skin to skin;Hand expression;Alternate breast massage  Type of Nipple: Everted at rest and after stimulation (needs stimulation)  Comfort (Breast/Nipple): Filling, red/small blisters or bruises, mild/mod discomfort  Problem noted: Mild/Moderate discomfort Interventions (Mild/moderate discomfort): Pre-pump if needed;Comfort gels;Hand expression;Hand massage  Hold (Positioning): Assistance needed to correctly position infant at breast and maintain latch. Intervention(s): Breastfeeding basics reviewed;Support Pillows;Position options;Skin to skin  LATCH Score: 7  Lactation Tools  Discussed/Used Tools: Shells;Nipple Shields;Pump;Comfort gels Nipple shield size: 16 Shell Type: Inverted Breast pump type: Manual Initiated by:: Peri JeffersonL. Salia Cangemi RN Date initiated:: 04/01/14   Consult Status Consult Status: Follow-up Date: 04/02/14 Follow-up type: In-patient    Charyl DancerCARVER, Mikah Rottinghaus G 04/01/2014, 1:33 PM

## 2014-04-01 NOTE — Progress Notes (Signed)
Subjective: Postpartum Day 2: Cesarean Delivery Patient reports tolerating PO, + flatus and no problems voiding.    Objective: Vital signs in last 24 hours: Temp:  [98.2 F (36.8 C)-98.4 F (36.9 C)] 98.4 F (36.9 C) (07/10 1757) Pulse Rate:  [84-91] 91 (07/10 1757) Resp:  [18] 18 (07/10 1757) BP: (110-120)/(76-91) 120/91 mmHg (07/10 1757) SpO2:  [99 %] 99 % (07/10 0810)  Physical Exam:  General: alert and cooperative Lochia: appropriate Uterine Fundus: firm Incision: C/D/I    Recent Labs  03/30/14 0315 03/31/14 0550  HGB 12.3 9.9*  HCT 37.2 30.6*    Assessment/Plan: Status post Cesarean section. Doing well postoperatively.  Continue current care.  Oliver PilaRICHARDSON,Miara Emminger W 04/01/2014, 6:55 AM

## 2014-04-02 MED ORDER — IBUPROFEN 600 MG PO TABS: 600.0000 mg | ORAL_TABLET | Freq: Four times a day (QID) | ORAL | Status: DC | PRN

## 2014-04-02 MED ORDER — OXYCODONE-ACETAMINOPHEN 5-325 MG PO TABS: 1.0000 | ORAL_TABLET | ORAL | Status: DC | PRN

## 2014-04-02 NOTE — Lactation Note (Addendum)
This note was copied from the chart of Sarah Mable Fillmily Ratz. Lactation Consultation Note  Upon entering room, mother has infant in cross cradle hold.  Rhythmical sucks and swallows observed. Stools transitioning.  Assessed infants mouth.  Labial frenulum does not blanch when turned up and noticed adequate tongue movement. Reviewed hand expression and engorgement care.  Mother's breast are filling. Provided mother with ice packs. Encouraged mother to massage breasts while breastfeeding and post pump to soften with hand pump if engorged. Gave mother a foley cup for any breastmilk pumped and demonstrated use. Encouraged mother to call if further assistance is needed.   Patient Name: Sarah [name] ZOXWR'UToday's Date: 04/02/2014 Reason for consult: Follow-up assessment   Maternal Data    Feeding Feeding Type: Breast Fed  LATCH Score/Interventions Latch: Grasps breast easily, tongue down, lips flanged, rhythmical sucking.  Audible Swallowing: Spontaneous and intermittent Intervention(s): Alternate breast massage  Type of Nipple: Everted at rest and after stimulation  Comfort (Breast/Nipple): Filling, red/small blisters or bruises, mild/mod discomfort  Problem noted: Mild/Moderate discomfort Interventions (Mild/moderate discomfort): Comfort gels;Hand expression  Hold (Positioning): No assistance needed to correctly position infant at breast.  LATCH Score: 9  Lactation Tools Discussed/Used     Consult Status Consult Status: Complete    Hardie PulleyBerkelhammer, Lark Runk Boschen 04/02/2014, 8:41 AM

## 2014-04-02 NOTE — Progress Notes (Signed)
Subjective: Postpartum Day 3: Cesarean Delivery Patient reports tolerating PO, + flatus and no problems voiding.    Objective: Vital signs in last 24 hours: Temp:  [98 F (36.7 C)-98.8 F (37.1 C)] 98.8 F (37.1 C) (07/12 0534) Pulse Rate:  [86-102] 86 (07/12 0534) Resp:  [18-20] 20 (07/12 0534) BP: (110-127)/(76-81) 110/76 mmHg (07/12 0534)  Physical Exam:  General: alert and cooperative Lochia: appropriate Uterine Fundus: firm Incision: C/D/I    Recent Labs  03/31/14 0550  HGB 9.9*  HCT 30.6*    Assessment/Plan: Status post Cesarean section. Doing well postoperatively.  Discharge home with standard precautions and return to office in 2 weeks.  Oliver PilaICHARDSON,Zariya Minner W 04/02/2014, 10:02 AM

## 2014-04-02 NOTE — Discharge Summary (Signed)
Obstetric Discharge Summary Reason for Admission: onset of labor Prenatal Procedures: ultrasound Intrapartum Procedures: cesarean: low cervical, transverse Postpartum Procedures: none Complications-Operative and Postpartum: none Hemoglobin  Date Value Ref Range Status  03/31/2014 9.9* 12.0 - 15.0 g/dL Final     DELTA CHECK NOTED     REPEATED TO VERIFY     HCT  Date Value Ref Range Status  03/31/2014 30.6* 36.0 - 46.0 % Final    Physical Exam:  General: alert and cooperative Lochia: appropriate Uterine Fundus: firm Incision: C/D/I  Discharge Diagnoses: Term Pregnancy-delivered                                         Breech presentation Discharge Information: Date: 04/02/2014 Activity: pelvic rest Diet: routine Medications: Ibuprofen and Percocet Condition: improved Instructions: refer to practice specific booklet Discharge to: home Follow-up Information   Follow up with MEISINGER,TODD D, MD. Schedule an appointment as soon as possible for a visit in 2 weeks. (incision check)    Specialty:  Obstetrics and Gynecology   Contact information:   [address], [address] [place] [address] [phone]       Newborn Data: Live born female  Birth Weight: 6 lb 13 oz (3090 g) APGAR: 9, 9  Home with mother.  Oliver PilaICHARDSON,Brenyn Petrey W 04/02/2014, 10:04 AM

## 2014-07-24 ENCOUNTER — Encounter (HOSPITAL_COMMUNITY): Payer: Self-pay | Admitting: Obstetrics and Gynecology

## 2015-06-21 LAB — OB RESULTS CONSOLE RUBELLA ANTIBODY, IGM: RUBELLA: IMMUNE

## 2015-06-21 LAB — OB RESULTS CONSOLE RPR: RPR: NONREACTIVE

## 2015-06-21 LAB — OB RESULTS CONSOLE GC/CHLAMYDIA
Chlamydia: NEGATIVE
Gonorrhea: NEGATIVE

## 2015-06-21 LAB — OB RESULTS CONSOLE HEPATITIS B SURFACE ANTIGEN: Hepatitis B Surface Ag: NEGATIVE

## 2015-06-21 LAB — OB RESULTS CONSOLE ANTIBODY SCREEN: Antibody Screen: NEGATIVE

## 2015-06-21 LAB — OB RESULTS CONSOLE HIV ANTIBODY (ROUTINE TESTING): HIV: NONREACTIVE

## 2015-06-21 LAB — OB RESULTS CONSOLE ABO/RH: RH TYPE: POSITIVE

## 2015-08-01 ENCOUNTER — Encounter (HOSPITAL_COMMUNITY): Payer: Self-pay | Admitting: *Deleted

## 2015-08-01 ENCOUNTER — Inpatient Hospital Stay (HOSPITAL_COMMUNITY)
Admission: AD | Admit: 2015-08-01 | Discharge: 2015-08-01 | Disposition: A | Discharge status: Home / Self Care | Payer: BC Managed Care – PPO | Source: Ambulatory Visit | Attending: Obstetrics and Gynecology | Admitting: Obstetrics and Gynecology

## 2015-08-01 DIAGNOSIS — R42 Dizziness and giddiness: Secondary | ICD-10-CM | POA: Diagnosis not present

## 2015-08-01 DIAGNOSIS — O26892 Other specified pregnancy related conditions, second trimester: Secondary | ICD-10-CM | POA: Diagnosis not present

## 2015-08-01 DIAGNOSIS — O210 Mild hyperemesis gravidarum: Secondary | ICD-10-CM | POA: Insufficient documentation

## 2015-08-01 DIAGNOSIS — O219 Vomiting of pregnancy, unspecified: Secondary | ICD-10-CM | POA: Diagnosis not present

## 2015-08-01 DIAGNOSIS — R002 Palpitations: Secondary | ICD-10-CM

## 2015-08-01 DIAGNOSIS — R55 Syncope and collapse: Secondary | ICD-10-CM | POA: Diagnosis not present

## 2015-08-01 LAB — COMPREHENSIVE METABOLIC PANEL
ALT: 12 U/L — ABNORMAL LOW (ref 14–54)
ANION GAP: 10 (ref 5–15)
AST: 21 U/L (ref 15–41)
Albumin: 3.9 g/dL (ref 3.5–5.0)
Alkaline Phosphatase: 59 U/L (ref 38–126)
BILIRUBIN TOTAL: 0.3 mg/dL (ref 0.3–1.2)
BUN: 9 mg/dL (ref 6–20)
CO2: 25 mmol/L (ref 22–32)
Calcium: 9.4 mg/dL (ref 8.9–10.3)
Chloride: 102 mmol/L (ref 101–111)
Creatinine, Ser: 0.4 mg/dL — ABNORMAL LOW (ref 0.44–1.00)
Glucose, Bld: 86 mg/dL (ref 65–99)
POTASSIUM: 4 mmol/L (ref 3.5–5.1)
Sodium: 137 mmol/L (ref 135–145)
TOTAL PROTEIN: 7.6 g/dL (ref 6.5–8.1)

## 2015-08-01 LAB — URINALYSIS, ROUTINE W REFLEX MICROSCOPIC
Bilirubin Urine: NEGATIVE
Glucose, UA: 100 mg/dL — AB
Hgb urine dipstick: NEGATIVE
KETONES UR: NEGATIVE mg/dL
LEUKOCYTES UA: NEGATIVE
Nitrite: NEGATIVE
PROTEIN: NEGATIVE mg/dL
Specific Gravity, Urine: <1.005 — ABNORMAL LOW (ref 1.005–1.030)
Urobilinogen, UA: 0.2 mg/dL (ref 0.0–1.0)
pH: 5.5 (ref 5.0–8.0)

## 2015-08-01 LAB — CBC
HEMATOCRIT: 36.1 % (ref 36.0–46.0)
HEMOGLOBIN: 12.3 g/dL (ref 12.0–15.0)
MCH: 30.6 pg (ref 26.0–34.0)
MCHC: 34.1 g/dL (ref 30.0–36.0)
MCV: 89.8 fL (ref 78.0–100.0)
PLATELETS: 290 10*3/uL (ref 150–400)
RBC: 4.02 MIL/uL (ref 3.87–5.11)
RDW: 13 % (ref 11.5–15.5)
WBC: 10.9 10*3/uL — AB (ref 4.0–10.5)

## 2015-08-01 NOTE — Discharge Instructions (Signed)
Palpitations A palpitation is the feeling that your heartbeat is irregular or is faster than normal. It may feel like your heart is fluttering or skipping a beat. Palpitations are usually not a serious problem. However, in some cases, you may need further medical evaluation. CAUSES  Palpitations can be caused by:  Smoking.  Caffeine or other stimulants, such as diet pills or energy drinks.  Alcohol.  Stress and anxiety.  Strenuous physical activity.  Fatigue.  Certain medicines.  Heart disease, especially if you have a history of irregular heart rhythms (arrhythmias), such as atrial fibrillation, atrial flutter, or supraventricular tachycardia.  An improperly working pacemaker or defibrillator. DIAGNOSIS  To find the cause of your palpitations, your health care provider will take your medical history and perform a physical exam. Your health care provider may also have you take a test called an ambulatory electrocardiogram (ECG). An ECG records your heartbeat patterns over a 24-hour period. You may also have other tests, such as:  Transthoracic echocardiogram (TTE). During echocardiography, sound waves are used to evaluate how blood flows through your heart.  Transesophageal echocardiogram (TEE).  Cardiac monitoring. This allows your health care provider to monitor your heart rate and rhythm in real time.  Holter monitor. This is a portable device that records your heartbeat and can help diagnose heart arrhythmias. It allows your health care provider to track your heart activity for several days, if needed.  Stress tests by exercise or by giving medicine that makes the heart beat faster. TREATMENT  Treatment of palpitations depends on the cause of your symptoms and can vary greatly. Most cases of palpitations do not require any treatment other than time, relaxation, and monitoring your symptoms. Other causes, such as atrial fibrillation, atrial flutter, or supraventricular  tachycardia, usually require further treatment. HOME CARE INSTRUCTIONS   Avoid:  Caffeinated coffee, tea, soft drinks, diet pills, and energy drinks.  Chocolate.  Alcohol.  Stop smoking if you smoke.  Reduce your stress and anxiety. Things that can help you relax include:  A method of controlling things in your body, such as your heartbeats, with your mind (biofeedback).  Yoga.  Meditation.  Physical activity such as swimming, jogging, or walking.  Get plenty of rest and sleep. SEEK MEDICAL CARE IF:   You continue to have a fast or irregular heartbeat beyond 24 hours.  Your palpitations occur more often. SEEK IMMEDIATE MEDICAL CARE IF:  You have chest pain or shortness of breath.  You have a severe headache.  You feel dizzy or you faint. MAKE SURE YOU:  Understand these instructions.  Will watch your condition.  Will get help right away if you are not doing well or get worse.   This information is not intended to replace advice given to you by your health care provider. Make sure you discuss any questions you have with your health care provider.   Document Released: 09/05/2000 Document Revised: 09/13/2013 Document Reviewed: 11/07/2011 Elsevier Interactive Patient Education 2016 [employer].  Near-Syncope Near-syncope (commonly known as near fainting) is sudden weakness, dizziness, or feeling like you might pass out. During an episode of near-syncope, you may also develop pale skin, have tunnel vision, or feel sick to your stomach (nauseous). Near-syncope may occur when getting up after sitting or while standing for a long time. It is caused by a sudden decrease in blood flow to the brain. This decrease can result from various causes or triggers, most of which are not serious. However, because near-syncope can sometimes be  a sign of something serious, a medical evaluation is required. The specific cause is often not determined. HOME CARE INSTRUCTIONS  Monitor your  condition for any changes. The following actions may help to alleviate any discomfort you are experiencing:  Have someone stay with you until you feel stable.  Lie down right away and prop your feet up if you start feeling like you might faint. Breathe deeply and steadily. Wait until all the symptoms have passed. Most of these episodes last only a few minutes. You may feel tired for several hours.   Drink enough fluids to keep your urine clear or pale yellow.   If you are taking blood pressure or heart medicine, get up slowly when seated or lying down. Take several minutes to sit and then stand. This can reduce dizziness.  Follow up with your health care provider as directed. SEEK IMMEDIATE MEDICAL CARE IF:   You have a severe headache.   You have unusual pain in the chest, abdomen, or back.   You are bleeding from the mouth or rectum, or you have black or tarry stool.   You have an irregular or very fast heartbeat.   You have repeated fainting or have seizure-like jerking during an episode.   You faint when sitting or lying down.   You have confusion.   You have difficulty walking.   You have severe weakness.   You have vision problems.  MAKE SURE YOU:   Understand these instructions.  Will watch your condition.  Will get help right away if you are not doing well or get worse.   This information is not intended to replace advice given to you by your health care provider. Make sure you discuss any questions you have with your health care provider.   Document Released: 09/08/2005 Document Revised: 09/13/2013 Document Reviewed: 02/11/2013 Elsevier Interactive Patient Education 2016 [employer] Inc[employer] Morning Sickness Morning sickness is when you feel sick to your stomach (nauseous) during pregnancy. This nauseous feeling may or may not come with vomiting. It often occurs in the morning but can be a problem any time of day. Morning sickness is most common during the  first trimester, but it may continue throughout pregnancy. While morning sickness is unpleasant, it is usually harmless unless you develop severe and continual vomiting (hyperemesis gravidarum). This condition requires more intense treatment.  CAUSES  The cause of morning sickness is not completely known but seems to be related to normal hormonal changes that occur in pregnancy. RISK FACTORS You are at greater risk if you:  Experienced nausea or vomiting before your pregnancy.  Had morning sickness during a previous pregnancy.  Are pregnant with more than one baby, such as twins. TREATMENT  Do not use any medicines (prescription, over-the-counter, or herbal) for morning sickness without first talking to your health care provider. Your health care provider may prescribe or recommend:  Vitamin B6 supplements.  Anti-nausea medicines.  The herbal medicine ginger. HOME CARE INSTRUCTIONS   Only take over-the-counter or prescription medicines as directed by your health care provider.  Taking multivitamins before getting pregnant can prevent or decrease the severity of morning sickness in most women.  Eat a piece of dry toast or unsalted crackers before getting out of bed in the morning.  Eat five or six small meals a day.  Eat dry and bland foods (rice, baked potato). Foods high in carbohydrates are often helpful.  Do not drink liquids with your meals. Drink liquids between meals.  Avoid greasy,  fatty, and spicy foods.  Get someone to cook for you if the smell of any food causes nausea and vomiting.  If you feel nauseous after taking prenatal vitamins, take the vitamins at night or with a snack.  Snack on protein foods (nuts, yogurt, cheese) between meals if you are hungry.  Eat unsweetened gelatins for desserts.  Wearing an acupressure wristband (worn for sea sickness) may be helpful.  Acupuncture may be helpful.  Do not smoke.  Get a humidifier to keep the air in your  house free of odors.  Get plenty of fresh air. SEEK MEDICAL CARE IF:   Your home remedies are not working, and you need medicine.  You feel dizzy or lightheaded.  You are losing weight. SEEK IMMEDIATE MEDICAL CARE IF:   You have persistent and uncontrolled nausea and vomiting.  You pass out (faint). MAKE SURE YOU:  Understand these instructions.  Will watch your condition.  Will get help right away if you are not doing well or get worse.   This information is not intended to replace advice given to you by your health care provider. Make sure you discuss any questions you have with your health care provider.   Document Released: 10/30/2006 Document Revised: 09/13/2013 Document Reviewed: 02/23/2013 Elsevier Interactive Patient Education [employer]

## 2015-08-01 NOTE — MAU Provider Note (Signed)
Chief Complaint: Dizziness   First Provider Initiated Contact with Patient 08/01/15 1715      SUBJECTIVE HPI: Sarah [name] is a 29 y.o. 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 She reports 1 prior episode of this that occurred at home a few weeks ago. She discussed this episode with Dr Ellyn Hack who recommended referral to cardiology if symptoms persisted.  She reports drinking 3 large cups (100 ounces) of water and eating lunch and frequent snacks prior to the episode today. She did take phenergan 25 mg tablet this morning for nausea and she usually takes 12.5 mg occasionally when she needs this medicine.  She denies feeling fatigue after the medication.  She denies abdominal pain, vaginal bleeding, vaginal itching/burning, urinary symptoms, h/a, n/v, or fever/chills.     Dizziness This is a recurrent problem. The current episode started 1 to 4 weeks ago. The problem occurs intermittently. The problem has been waxing and waning. Associated symptoms include diaphoresis, nausea and a visual change. Pertinent negatives include no abdominal pain, chest pain, chills, congestion, coughing, fatigue, fever, headaches, neck pain, urinary symptoms, vertigo, vomiting or weakness. The symptoms are aggravated by standing. She has tried drinking, eating and lying down for the symptoms. The treatment provided mild relief.    Past Medical History  Diagnosis Date  . Asthma     inhaler last used June 2015   Past Surgical History  Procedure Laterality Date  . Tonsillectomy    . Tympanostomy tube placement    . Cesarean section N/A 03/30/2014    Procedure: CESAREAN SECTION;  Surgeon: Lavina Hamman, MD;  Location: [facility];  Service: Obstetrics;  Laterality: N/A;   Social History   Social History  . Marital Status: Single    Spouse Name: N/A  . Number of  Children: N/A  . Years of Education: N/A   Occupational History  . Not on file.   Social History Main Topics  . Smoking status: Never Smoker   . Smokeless tobacco: Never Used  . Alcohol Use: No  . Drug Use: No  . Sexual Activity: Not on file   Other Topics Concern  . Not on file   Social History Narrative   No current facility-administered medications on file prior to encounter.   Current Outpatient Prescriptions on File Prior to Encounter  Medication Sig Dispense Refill  . albuterol (PROVENTIL HFA;[name] 108 (90 BASE) MCG/ACT inhaler Inhale 1-2 puffs into the lungs every 6 (six) hours as needed for wheezing or shortness of breath.     . Prenatal Vit-Fe Fumarate-FA (PRENATAL MULTIVITAMIN) TABS tablet Take 1 tablet by mouth daily at 12 noon.     Allergies  Allergen Reactions  . Latex Rash  . Penicillins Anxiety    No rash; childhood reaction    ROS:  Review of Systems  Constitutional: Positive for diaphoresis. Negative for fever, chills and fatigue.  HENT: Negative for congestion and sinus pressure.   Eyes: Negative for photophobia.  Respiratory: Negative for cough and shortness of breath.   Cardiovascular: Negative for chest pain.  Gastrointestinal: Positive for nausea. Negative for vomiting, abdominal pain, diarrhea and constipation.  Genitourinary: Negative for dysuria, frequency, flank pain, vaginal bleeding, vaginal discharge, difficulty urinating, vaginal pain and pelvic pain.  Musculoskeletal: Negative for neck pain.  Neurological: Positive for dizziness. Negative for vertigo, weakness and headaches.  Psychiatric/Behavioral: Negative.      I have reviewed patient's Past Medical Hx, Surgical Hx, Family Hx, Social Hx, medications and allergies.   Physical Exam   Patient Vitals for the past 24 hrs:  BP Temp Temp src Pulse Resp SpO2 Height Weight  08/01/15 1823 113/57 mmHg 98.6 F (37 C) Oral 81 18 - - -  08/01/15 1705 - - - - - -  (1.575 m) 120 lb  (54.432 kg)  08/01/15 1654 - - - - - 98 % - -   Constitutional: Well-developed, well-nourished female in no acute distress.  Cardiovascular: normal rate Respiratory: normal effort GI: Abd soft, non-tender. Pos BS x 4 MS: Extremities nontender, no edema, normal ROM Neurologic: Alert and oriented x 4.  GU: Neg CVAT.  PELVIC EXAM: Cervix pink, visually closed, without lesion, scant white creamy discharge, vaginal walls and external genitalia normal Bimanual exam: Cervix 0/long/high, firm, anterior, neg CMT, uterus nontender, nonenlarged, adnexa without tenderness, enlargement, or mass  FHT 174 by doppler  LAB RESULTS Results for orders placed or performed during the hospital encounter of 08/01/15 (from the past 24 hour(s))  Urinalysis, Routine w reflex microscopic (not at [facility])     Status: Abnormal   Collection Time: 08/01/15  4:55 PM  Result Value Ref Range   Color, Urine YELLOW YELLOW   APPearance CLEAR CLEAR   Specific Gravity, Urine <1.005 (L) 1.005 - 1.030   pH 5.5 5.0 - 8.0   Glucose, UA 100 (A) NEGATIVE mg/dL   Hgb urine dipstick NEGATIVE NEGATIVE   Bilirubin Urine NEGATIVE NEGATIVE   Ketones, ur NEGATIVE NEGATIVE mg/dL   Protein, ur NEGATIVE NEGATIVE mg/dL   Urobilinogen, UA 0.2 0.0 - 1.0 mg/dL   Nitrite NEGATIVE NEGATIVE   Leukocytes, UA NEGATIVE NEGATIVE  CBC     Status: Abnormal   Collection Time: 08/01/15  5:14 PM  Result Value Ref Range   WBC 10.9 (H) 4.0 - 10.5 K/uL   RBC 4.02 3.87 - 5.11 MIL/uL   Hemoglobin 12.3 12.0 - 15.0 g/dL   HCT 16.1 09.6 - 04.5 %   MCV 89.8 78.0 - 100.0 fL   MCH 30.6 26.0 - 34.0 pg   MCHC 34.1 30.0 - 36.0 g/dL   RDW 40.9 81.1 - 91.4 %   Platelets 290 150 - 400 K/uL  Comprehensive metabolic panel     Status: Abnormal   Collection Time: 08/01/15  5:14 PM  Result Value Ref Range   Sodium 137 135 - 145 mmol/L   Potassium 4.0 3.5 - 5.1 mmol/L   Chloride 102 101 - 111 mmol/L   CO2 25 22 - 32 mmol/L   Glucose, Bld 86 65 - 99 mg/dL    BUN 9 6 - 20 mg/dL   Creatinine, Ser 7.82 (L) 0.44 - 1.00 mg/dL   Calcium 9.4 8.9 - 95.6 mg/dL   Total Protein 7.6 6.5 - 8.1 g/dL   Albumin 3.9 3.5 - 5.0 g/dL   AST 21 15 - 41 U/L   ALT 12 (L) 14 - 54 U/L   Alkaline Phosphatase 59 38 - 126 U/L   Total Bilirubin 0.3 0.3 - 1.2 mg/dL   GFR calc non Af Amer >60 >60 mL/min   GFR calc Af Amer >60 >60 mL/min   Anion gap 10 5 - 15       IMAGING No results found.  MAU Management/MDM: Ordered labs, EKG, and reviewed results.  Consult Dr Ambrose Mantle.  No acute findings today with normal EKG  and labs.  Plan for outpatient cardiology follow up.  Pt stable at time of discharge.  ASSESSMENT 1. Dizziness   2. Near syncope   3. Nausea and vomiting during pregnancy prior to [redacted] weeks gestation   4. Palpitations     PLAN Discharge home Pt to call office tomorrow and f/u on referral Return to MAU as needed for emergencies    Medication List    TAKE these medications        albuterol 108 (90 BASE) MCG/ACT inhaler  Commonly known as:  PROVENTIL HFA;VENTOLIN HFA  Inhale 1-2 puffs into the lungs every 6 (six) hours as needed for wheezing or shortness of breath.     prenatal multivitamin Tabs tablet  Take 1 tablet by mouth daily at 12 noon.     promethazine 25 MG tablet  Commonly known as:  PHENERGAN  Take 12.5 mg by mouth every 6 (six) hours as needed for nausea or vomiting.       Follow-up Information    Follow up with Sherian ReinBovard-Stuckert, Jody, MD.   Specialty:  Obstetrics and Gynecology   Why:  Office is making you a referral to cardiology.  Call tomorrow, 11/10, to follow up.     Contact information:   [address] [place] [address] [phone]       Follow up with [facility].   Why:  As needed for emergencies   Contact information:   [address] [id number] [place] [id number] [phone]      Sharen CounterLisa Leftwich-Kirby Certified  Nurse-Midwife 08/01/2015  6:51 PM

## 2015-08-01 NOTE — MAU Note (Signed)
Pt presents to MAU with complaint of feeling faint earlier today. Pt states she has a flutter in her heart at times and was told by Dr Ellyn HackBovard if it keeps happening she would have to refer her to a cardiologist.

## 2015-08-01 NOTE — MAU Note (Signed)
Urine sent to lab 

## 2015-08-02 ENCOUNTER — Encounter: Payer: Self-pay | Admitting: Cardiology

## 2015-08-02 ENCOUNTER — Ambulatory Visit (INDEPENDENT_AMBULATORY_CARE_PROVIDER_SITE_OTHER): Payer: BC Managed Care – PPO | Admitting: Cardiology

## 2015-08-02 VITALS — BP 112/58 | HR 93 | Ht 62.0 in | Wt 124.0 lb

## 2015-08-02 DIAGNOSIS — O26812 Pregnancy related exhaustion and fatigue, second trimester: Secondary | ICD-10-CM | POA: Diagnosis not present

## 2015-08-02 DIAGNOSIS — R002 Palpitations: Secondary | ICD-10-CM | POA: Insufficient documentation

## 2015-08-02 NOTE — Progress Notes (Signed)
Cardiology Office Note   Date:  08/02/2015   ID:  Sarah [name], DOB [birth year], MRN [id number]  PCP:  Carylon PerchesFAGAN,ROY, MD  Cardiologist:   Rollene RotundaJames Sherly Brodbeck, MD   Chief Complaint  Patient presents with  . Palpitations      History of Present Illness: Sarah [name] is a 29 y.o. female who presents for evaluation of palpitations.   She has no past cardiac history. She is [redacted] weeks pregnant. [facility] with rapid heart rate. 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 She felt her heart skipping and fluttering , she got warm and presyncopal. She had some blurred vision and had to sit down. I did review the emergency room records and there were no significant abnormalities noted. Her symptoms passed. She had an episode of the same thing about 2 weeks ago. She otherwise is well. She is active. She typically denies any chest pressure, neck or arm discomfort. She doesn't have any palpitations typically other than these 2 episodes. She doesn't have any presyncope or syncope. He's had no new shortness of breath. She has had some nausea and thought maybe her symptoms were related to the fact she Phenergan yesterday.  Past Medical History  Diagnosis Date  . Asthma     Past Surgical History  Procedure Laterality Date  . Tonsillectomy    . Tympanostomy tube placement    . Cesarean section N/A 03/30/2014    Procedure: CESAREAN SECTION;  Surgeon: Lavina Hammanodd Meisinger, MD;  Location: [facility];  Service: Obstetrics;  Laterality: N/A;     Current Outpatient Prescriptions  Medication Sig Dispense Refill  . albuterol (PROVENTIL HFA;[name] 108 (90 BASE) MCG/ACT inhaler Inhale 1-2 puffs into the lungs every 6 (six) hours as needed for wheezing or shortness of breath.     . Prenatal Vit-Fe Fumarate-FA (PRENATAL MULTIVITAMIN) TABS tablet Take 1 tablet by mouth daily at 12 noon.    . promethazine (PHENERGAN) 25 MG tablet Take 12.5 mg by mouth every 6 (six) hours  as needed for nausea or vomiting.     No current facility-administered medications for this visit.    Allergies:   Latex and Penicillins    Social History:  The patient  reports that she has never smoked. She has never used smokeless tobacco. She reports that she does not drink alcohol or use illicit drugs.   Family History:  The patient's family history includes Cancer in her father, paternal uncle, and sister; Heart attack (age of onset: 3458) in her paternal uncle; Heart attack (age of onset: 770) in her paternal grandfather; Hypertension in her father and mother.    ROS:  Please see the history of present illness.   Otherwise, review of systems are positive for none.   All other systems are reviewed and negative.    PHYSICAL EXAM: VS:  BP 112/58 mmHg  Pulse 93  Ht 5\' 2"  (1.575 m)  Wt 124 lb (56.246 kg)  BMI 22.67 kg/m2  LMP 04/24/2015 , BMI Body mass index is 22.67 kg/(m^2). GENERAL:  Well appearing HEENT:  Pupils equal round and reactive, fundi not visualized, oral mucosa unremarkable NECK:  No jugular venous distention, waveform within normal limits, carotid upstroke brisk and symmetric, no bruits, no thyromegaly LYMPHATICS:  No cervical, inguinal adenopathy LUNGS:  Clear to auscultation bilaterally BACK:  No CVA tenderness CHEST:  Unremarkable HEART:  PMI not displaced or sustained,S1 and S2 within normal limits, no S3,  no S4, no clicks, no rubs, no murmurs ABD:  Flat, positive bowel sounds normal in frequency in pitch, no bruits, no rebound, no guarding, no midline pulsatile mass, no hepatomegaly, no splenomegaly EXT:  2 plus pulses throughout, no edema, no cyanosis no clubbing SKIN:  No rashes no nodules NEURO:  Cranial nerves II through XII grossly intact, motor grossly intact throughout PSYCH:  Cognitively intact, oriented to person place and time    EKG:  EKG is ordered today. The ekg ordered today demonstrates  Sinus rhythm , rate 93, axis within normal limits ,  nonspecific diffuse ST changes with some mild ST depression.   Recent Labs: 08/01/2015: ALT 12*; BUN 9; Creatinine, Ser 0.40*; Hemoglobin 12.3; Platelets 290; Potassium 4.0; Sodium 137    Lipid Panel No results found for: CHOL, TRIG, HDL, CHOLHDL, VLDL, LDLCALC, LDLDIRECT    Wt Readings from Last 3 Encounters:  08/02/15 124 lb (56.246 kg)  08/01/15 120 lb (54.432 kg)  03/30/14 135 lb (61.236 kg)      Other studies Reviewed: Additional studies/ records that were reviewed today include: Office and ED records.. Review of the above records demonstrates:  Please see elsewhere in the note.      ASSESSMENT AND PLAN:  PALPITATIONS:   I'm going to start with a TSH an echocardiogram to make sure it she has a structurally normal heart. Given the fact this was only the second event I'm not going to apply a monitor but if it recurs she will likely need an event monitor. For now we will not start any other medications. Her labs yesterday   Current medicines are reviewed at length with the patient today.  The patient does not have concerns regarding medicines.  The following changes have been made:  no change  Labs/ tests ordered today include:   Orders Placed This Encounter  Procedures  . TSH  . EKG 12-Lead  . ECHOCARDIOGRAM COMPLETE     Disposition:   FU with me as needed.      Signed, Rollene Rotunda, MD  08/02/2015 5:18 PM    [facility]

## 2015-08-02 NOTE — Patient Instructions (Signed)
Your physician recommends that you schedule a follow-up appointment As Needed  Your physician [facility]  Your physician has requested that you have an echocardiogram. Echocardiography is a painless test that uses sound waves to create images of your heart. It provides your doctor with information about the size and shape of your heart and how well your heart's chambers and valves are working. This procedure takes approximately one hour. There are no restrictions for this procedure.

## 2015-08-15 ENCOUNTER — Other Ambulatory Visit: Payer: Self-pay

## 2015-08-15 ENCOUNTER — Ambulatory Visit (HOSPITAL_COMMUNITY): Payer: BC Managed Care – PPO | Attending: Cardiology

## 2015-08-15 DIAGNOSIS — I313 Pericardial effusion (noninflammatory): Secondary | ICD-10-CM | POA: Diagnosis not present

## 2015-08-15 DIAGNOSIS — R002 Palpitations: Secondary | ICD-10-CM | POA: Diagnosis not present

## 2015-08-15 DIAGNOSIS — Z3A16 16 weeks gestation of pregnancy: Secondary | ICD-10-CM | POA: Insufficient documentation

## 2015-08-16 LAB — TSH: TSH: 1.268 u[IU]/mL (ref 0.350–4.500)

## 2015-08-28 ENCOUNTER — Telehealth: Payer: Self-pay | Admitting: Cardiology

## 2015-08-28 NOTE — Telephone Encounter (Signed)
Patient states she has not received any of her test results from a couple of weeks ago.

## 2015-08-28 NOTE — Telephone Encounter (Signed)
Left message for patient to call.

## 2015-08-28 NOTE — Telephone Encounter (Signed)
Pt informed of results & that these were sent to PCP. Understanding verbalized.

## 2015-12-25 LAB — OB RESULTS CONSOLE GBS: GBS: NEGATIVE

## 2016-01-16 ENCOUNTER — Telehealth (HOSPITAL_COMMUNITY): Payer: Self-pay | Admitting: *Deleted

## 2016-01-16 ENCOUNTER — Encounter (HOSPITAL_COMMUNITY): Payer: Self-pay | Admitting: *Deleted

## 2016-01-16 NOTE — Telephone Encounter (Signed)
Preadmission screen  

## 2016-01-18 ENCOUNTER — Inpatient Hospital Stay (HOSPITAL_COMMUNITY): Payer: BC Managed Care – PPO | Admitting: Anesthesiology

## 2016-01-18 ENCOUNTER — Encounter (HOSPITAL_COMMUNITY): Payer: Self-pay | Admitting: Anesthesiology

## 2016-01-18 ENCOUNTER — Encounter (HOSPITAL_COMMUNITY)
Admission: AD | Disposition: A | Discharge status: Home / Self Care | Payer: Self-pay | Source: Ambulatory Visit | Attending: Obstetrics and Gynecology

## 2016-01-18 ENCOUNTER — Encounter (HOSPITAL_COMMUNITY): Payer: Self-pay | Admitting: *Deleted

## 2016-01-18 ENCOUNTER — Inpatient Hospital Stay (HOSPITAL_COMMUNITY)
Admission: AD | Admit: 2016-01-18 | Discharge: 2016-01-21 | DRG: 765 | Disposition: A | Discharge status: Home / Self Care | Payer: BC Managed Care – PPO | Source: Ambulatory Visit | Attending: Obstetrics and Gynecology | Admitting: Obstetrics and Gynecology

## 2016-01-18 DIAGNOSIS — O34211 Maternal care for low transverse scar from previous cesarean delivery: Principal | ICD-10-CM | POA: Diagnosis present

## 2016-01-18 DIAGNOSIS — O4292 Full-term premature rupture of membranes, unspecified as to length of time between rupture and onset of labor: Secondary | ICD-10-CM | POA: Diagnosis present

## 2016-01-18 DIAGNOSIS — Z3A38 38 weeks gestation of pregnancy: Secondary | ICD-10-CM

## 2016-01-18 DIAGNOSIS — Z98891 History of uterine scar from previous surgery: Secondary | ICD-10-CM

## 2016-01-18 DIAGNOSIS — O26873 Cervical shortening, third trimester: Secondary | ICD-10-CM | POA: Diagnosis present

## 2016-01-18 DIAGNOSIS — O9952 Diseases of the respiratory system complicating childbirth: Secondary | ICD-10-CM | POA: Diagnosis present

## 2016-01-18 DIAGNOSIS — J45909 Unspecified asthma, uncomplicated: Secondary | ICD-10-CM | POA: Diagnosis present

## 2016-01-18 HISTORY — DX: History of uterine scar from previous surgery: Z98.891

## 2016-01-18 LAB — CBC
HCT: 30.3 % — ABNORMAL LOW (ref 36.0–46.0)
Hemoglobin: 10 g/dL — ABNORMAL LOW (ref 12.0–15.0)
MCH: 28 pg (ref 26.0–34.0)
MCHC: 33 g/dL (ref 30.0–36.0)
MCV: 84.9 fL (ref 78.0–100.0)
PLATELETS: 306 10*3/uL (ref 150–400)
RBC: 3.57 MIL/uL — ABNORMAL LOW (ref 3.87–5.11)
RDW: 13.4 % (ref 11.5–15.5)
WBC: 8.9 10*3/uL (ref 4.0–10.5)

## 2016-01-18 LAB — POCT FERN TEST: POCT FERN TEST: POSITIVE

## 2016-01-18 LAB — TYPE AND SCREEN
ABO/RH(D): O POS
ANTIBODY SCREEN: NEGATIVE

## 2016-01-18 SURGERY — Surgical Case
Anesthesia: Spinal | Site: Abdomen

## 2016-01-18 MED ORDER — OXYCODONE-ACETAMINOPHEN 5-325 MG PO TABS
1.0000 | ORAL_TABLET | ORAL | Status: DC | PRN
  Administered 2016-01-19 – 2016-01-21 (×11): 1 via ORAL
  Filled 2016-01-18 (×13): qty 1

## 2016-01-18 MED ORDER — KETOROLAC TROMETHAMINE 30 MG/ML IJ SOLN
30.0000 mg | Freq: Four times a day (QID) | INTRAMUSCULAR | Status: AC | PRN
  Administered 2016-01-18: 30 mg via INTRAMUSCULAR

## 2016-01-18 MED ORDER — DEXAMETHASONE SODIUM PHOSPHATE 4 MG/ML IJ SOLN
INTRAMUSCULAR | Status: DC | PRN
  Administered 2016-01-18: 4 mg via INTRAVENOUS

## 2016-01-18 MED ORDER — ALBUTEROL SULFATE (2.5 MG/3ML) 0.083% IN NEBU: 3.0000 mL | INHALATION_SOLUTION | Freq: Four times a day (QID) | RESPIRATORY_TRACT | Status: DC | PRN

## 2016-01-18 MED ORDER — ZOLPIDEM TARTRATE 5 MG PO TABS: 5.0000 mg | ORAL_TABLET | Freq: Every evening | ORAL | Status: DC | PRN

## 2016-01-18 MED ORDER — CALCIUM CARBONATE ANTACID 500 MG PO CHEW: 1.0000 | CHEWABLE_TABLET | Freq: Four times a day (QID) | ORAL | Status: DC | PRN

## 2016-01-18 MED ORDER — SIMETHICONE 80 MG PO CHEW
80.0000 mg | CHEWABLE_TABLET | ORAL | Status: DC
  Administered 2016-01-18 – 2016-01-20 (×3): 80 mg via ORAL
  Filled 2016-01-18 (×3): qty 1

## 2016-01-18 MED ORDER — SIMETHICONE 80 MG PO CHEW
80.0000 mg | CHEWABLE_TABLET | Freq: Three times a day (TID) | ORAL | Status: DC
  Administered 2016-01-18 – 2016-01-21 (×9): 80 mg via ORAL
  Filled 2016-01-18 (×10): qty 1

## 2016-01-18 MED ORDER — NALOXONE HCL 0.4 MG/ML IJ SOLN: 0.4000 mg | INTRAMUSCULAR | Status: DC | PRN

## 2016-01-18 MED ORDER — DIPHENHYDRAMINE HCL 25 MG PO CAPS: 25.0000 mg | ORAL_CAPSULE | ORAL | Status: DC | PRN

## 2016-01-18 MED ORDER — FAMOTIDINE IN NACL 20-0.9 MG/50ML-% IV SOLN
INTRAVENOUS | Status: AC
  Filled 2016-01-18: qty 50

## 2016-01-18 MED ORDER — KETOROLAC TROMETHAMINE 30 MG/ML IJ SOLN
INTRAMUSCULAR | Status: AC
  Filled 2016-01-18: qty 1

## 2016-01-18 MED ORDER — PHENYLEPHRINE 8 MG IN D5W 100 ML (0.08MG/ML) PREMIX OPTIME
INJECTION | INTRAVENOUS | Status: DC | PRN
  Administered 2016-01-18: 60 ug/min via INTRAVENOUS

## 2016-01-18 MED ORDER — MORPHINE SULFATE (PF) 0.5 MG/ML IJ SOLN
INTRAMUSCULAR | Status: AC
  Filled 2016-01-18: qty 10

## 2016-01-18 MED ORDER — METOCLOPRAMIDE HCL 5 MG/ML IJ SOLN
INTRAMUSCULAR | Status: AC
  Filled 2016-01-18: qty 2

## 2016-01-18 MED ORDER — LACTATED RINGERS IV SOLN
INTRAVENOUS | Status: DC
  Administered 2016-01-18 (×3): via INTRAVENOUS

## 2016-01-18 MED ORDER — LACTATED RINGERS IV SOLN: INTRAVENOUS | Status: DC

## 2016-01-18 MED ORDER — IBUPROFEN 600 MG PO TABS: 600.0000 mg | ORAL_TABLET | Freq: Four times a day (QID) | ORAL | Status: DC | PRN

## 2016-01-18 MED ORDER — BUPIVACAINE IN DEXTROSE 0.75-8.25 % IT SOLN
INTRATHECAL | Status: DC | PRN
  Administered 2016-01-18: 1.5 mL via INTRATHECAL

## 2016-01-18 MED ORDER — NALBUPHINE HCL 10 MG/ML IJ SOLN: 5.0000 mg | INTRAMUSCULAR | Status: DC | PRN

## 2016-01-18 MED ORDER — GENTAMICIN SULFATE 40 MG/ML IJ SOLN
INTRAVENOUS | Status: AC
  Administered 2016-01-18: 113 mL via INTRAVENOUS
  Filled 2016-01-18: qty 6.75

## 2016-01-18 MED ORDER — ONDANSETRON HCL 4 MG/2ML IJ SOLN: 4.0000 mg | Freq: Three times a day (TID) | INTRAMUSCULAR | Status: DC | PRN

## 2016-01-18 MED ORDER — KETOROLAC TROMETHAMINE 30 MG/ML IJ SOLN: 30.0000 mg | Freq: Four times a day (QID) | INTRAMUSCULAR | Status: AC | PRN

## 2016-01-18 MED ORDER — FENTANYL CITRATE (PF) 100 MCG/2ML IJ SOLN: 25.0000 ug | INTRAMUSCULAR | Status: DC | PRN

## 2016-01-18 MED ORDER — SCOPOLAMINE 1 MG/3DAYS TD PT72
MEDICATED_PATCH | TRANSDERMAL | Status: DC | PRN
  Administered 2016-01-18: 1 via TRANSDERMAL

## 2016-01-18 MED ORDER — OXYTOCIN 10 UNIT/ML IJ SOLN
INTRAMUSCULAR | Status: AC
  Filled 2016-01-18: qty 4

## 2016-01-18 MED ORDER — DIPHENHYDRAMINE HCL 25 MG PO CAPS
25.0000 mg | ORAL_CAPSULE | Freq: Four times a day (QID) | ORAL | Status: DC | PRN
Start: 2016-01-18 — End: 2016-01-21

## 2016-01-18 MED ORDER — DIPHENHYDRAMINE HCL 50 MG/ML IJ SOLN
INTRAMUSCULAR | Status: DC | PRN
  Administered 2016-01-18: 12.5 mg via INTRAVENOUS

## 2016-01-18 MED ORDER — LACTATED RINGERS IV SOLN: 2.5000 [IU]/h | INTRAVENOUS | Status: AC

## 2016-01-18 MED ORDER — SIMETHICONE 80 MG PO CHEW: 80.0000 mg | CHEWABLE_TABLET | ORAL | Status: DC | PRN

## 2016-01-18 MED ORDER — SODIUM CHLORIDE 0.9% FLUSH: 3.0000 mL | INTRAVENOUS | Status: DC | PRN

## 2016-01-18 MED ORDER — OXYCODONE-ACETAMINOPHEN 5-325 MG PO TABS: 2.0000 | ORAL_TABLET | ORAL | Status: DC | PRN

## 2016-01-18 MED ORDER — SCOPOLAMINE 1 MG/3DAYS TD PT72
MEDICATED_PATCH | TRANSDERMAL | Status: AC
  Filled 2016-01-18: qty 1

## 2016-01-18 MED ORDER — ACETAMINOPHEN 325 MG PO TABS
650.0000 mg | ORAL_TABLET | ORAL | Status: DC | PRN
  Administered 2016-01-18 – 2016-01-19 (×4): 650 mg via ORAL
  Filled 2016-01-18 (×4): qty 2

## 2016-01-18 MED ORDER — DEXAMETHASONE SODIUM PHOSPHATE 4 MG/ML IJ SOLN
INTRAMUSCULAR | Status: AC
  Filled 2016-01-18: qty 1

## 2016-01-18 MED ORDER — CITRIC ACID-SODIUM CITRATE 334-500 MG/5ML PO SOLN
30.0000 mL | Freq: Once | ORAL | Status: AC
  Administered 2016-01-18: 30 mL via ORAL

## 2016-01-18 MED ORDER — IBUPROFEN 800 MG PO TABS
800.0000 mg | ORAL_TABLET | Freq: Three times a day (TID) | ORAL | Status: DC
  Administered 2016-01-18 – 2016-01-21 (×8): 800 mg via ORAL
  Filled 2016-01-18 (×8): qty 1

## 2016-01-18 MED ORDER — ONDANSETRON HCL 4 MG/2ML IJ SOLN
INTRAMUSCULAR | Status: DC | PRN
  Administered 2016-01-18: 4 mg via INTRAVENOUS

## 2016-01-18 MED ORDER — NALOXONE HCL 2 MG/2ML IJ SOSY
1.0000 ug/kg/h | PREFILLED_SYRINGE | INTRAVENOUS | Status: DC | PRN
  Filled 2016-01-18: qty 2

## 2016-01-18 MED ORDER — CITRIC ACID-SODIUM CITRATE 334-500 MG/5ML PO SOLN
ORAL | Status: AC
  Filled 2016-01-18: qty 15

## 2016-01-18 MED ORDER — MENTHOL 3 MG MT LOZG: 1.0000 | LOZENGE | OROMUCOSAL | Status: DC | PRN

## 2016-01-18 MED ORDER — NALBUPHINE HCL 10 MG/ML IJ SOLN: 5.0000 mg | Freq: Once | INTRAMUSCULAR | Status: DC | PRN

## 2016-01-18 MED ORDER — DIPHENHYDRAMINE HCL 50 MG/ML IJ SOLN: 12.5000 mg | INTRAMUSCULAR | Status: DC | PRN

## 2016-01-18 MED ORDER — FENTANYL CITRATE (PF) 100 MCG/2ML IJ SOLN
INTRAMUSCULAR | Status: AC
  Filled 2016-01-18: qty 2

## 2016-01-18 MED ORDER — FAMOTIDINE IN NACL 20-0.9 MG/50ML-% IV SOLN
20.0000 mg | Freq: Once | INTRAVENOUS | Status: AC
  Administered 2016-01-18: 20 mg via INTRAVENOUS

## 2016-01-18 MED ORDER — PHENYLEPHRINE 8 MG IN D5W 100 ML (0.08MG/ML) PREMIX OPTIME
INJECTION | INTRAVENOUS | Status: AC
  Filled 2016-01-18: qty 100

## 2016-01-18 MED ORDER — SODIUM CHLORIDE 0.9 % IR SOLN
Status: DC | PRN
  Administered 2016-01-18: 1

## 2016-01-18 MED ORDER — SENNOSIDES-DOCUSATE SODIUM 8.6-50 MG PO TABS
2.0000 | ORAL_TABLET | ORAL | Status: DC
  Administered 2016-01-18 – 2016-01-20 (×3): 2 via ORAL
  Filled 2016-01-18 (×3): qty 2

## 2016-01-18 MED ORDER — DIPHENHYDRAMINE HCL 50 MG/ML IJ SOLN
INTRAMUSCULAR | Status: AC
  Filled 2016-01-18: qty 1

## 2016-01-18 MED ORDER — COCONUT OIL OIL
1.0000 "application " | TOPICAL_OIL | Status: DC | PRN
  Administered 2016-01-19: 1 via TOPICAL
  Filled 2016-01-18: qty 120

## 2016-01-18 MED ORDER — OXYTOCIN 10 UNIT/ML IJ SOLN
40.0000 [IU] | INTRAVENOUS | Status: DC | PRN
  Administered 2016-01-18: 40 [IU] via INTRAVENOUS

## 2016-01-18 MED ORDER — PRENATAL MULTIVITAMIN CH
1.0000 | ORAL_TABLET | Freq: Every day | ORAL | Status: DC
  Administered 2016-01-20: 1 via ORAL
  Filled 2016-01-18 (×3): qty 1

## 2016-01-18 MED ORDER — FENTANYL CITRATE (PF) 100 MCG/2ML IJ SOLN
INTRAMUSCULAR | Status: DC | PRN
  Administered 2016-01-18: 20 ug via INTRATHECAL
  Administered 2016-01-18: 25 ug via INTRAVENOUS

## 2016-01-18 MED ORDER — ONDANSETRON HCL 4 MG/2ML IJ SOLN
INTRAMUSCULAR | Status: AC
  Filled 2016-01-18: qty 2

## 2016-01-18 MED ORDER — MORPHINE SULFATE (PF) 0.5 MG/ML IJ SOLN
INTRAMUSCULAR | Status: DC | PRN
  Administered 2016-01-18: .2 mg via INTRATHECAL

## 2016-01-18 MED ORDER — MEPERIDINE HCL 25 MG/ML IJ SOLN: 6.2500 mg | INTRAMUSCULAR | Status: DC | PRN

## 2016-01-18 MED ORDER — METOCLOPRAMIDE HCL 5 MG/ML IJ SOLN
INTRAMUSCULAR | Status: DC | PRN
  Administered 2016-01-18: 10 mg via INTRAVENOUS

## 2016-01-18 MED ORDER — WITCH HAZEL-GLYCERIN EX PADS
1.0000 | MEDICATED_PAD | CUTANEOUS | Status: DC | PRN
Start: 2016-01-18 — End: 2016-01-21

## 2016-01-18 MED ORDER — DIBUCAINE 1 % RE OINT: 1.0000 "application " | TOPICAL_OINTMENT | RECTAL | Status: DC | PRN

## 2016-01-18 SURGICAL SUPPLY — 37 items
BENZOIN TINCTURE PRP APPL 2/3 (GAUZE/BANDAGES/DRESSINGS) ×3 IMPLANT
CHLORAPREP W/TINT 26ML (MISCELLANEOUS) ×3 IMPLANT
CLAMP CORD UMBIL (MISCELLANEOUS) IMPLANT
CLOSURE STERI STRIP 1/2 X4 (GAUZE/BANDAGES/DRESSINGS) ×3 IMPLANT
CLOTH BEACON ORANGE TIMEOUT ST (SAFETY) ×3 IMPLANT
CONTAINER PREFILL 10% NBF 15ML (MISCELLANEOUS) IMPLANT
DRSG OPSITE POSTOP 4X10 (GAUZE/BANDAGES/DRESSINGS) ×3 IMPLANT
ELECT REM PT RETURN 9FT ADLT (ELECTROSURGICAL) ×3
ELECTRODE REM PT RTRN 9FT ADLT (ELECTROSURGICAL) ×1 IMPLANT
EXTRACTOR VACUUM KIWI (MISCELLANEOUS) ×3 IMPLANT
EXTRACTOR VACUUM M CUP 4 TUBE (SUCTIONS) IMPLANT
EXTRACTOR VACUUM M CUP 4' TUBE (SUCTIONS)
GLOVE BIO SURGEON STRL SZ 6.5 (GLOVE) ×2 IMPLANT
GLOVE BIO SURGEONS STRL SZ 6.5 (GLOVE) ×1
GLOVE BIOGEL PI IND STRL 7.0 (GLOVE) ×1 IMPLANT
GLOVE BIOGEL PI INDICATOR 7.0 (GLOVE) ×2
GOWN STRL REUS W/TWL LRG LVL3 (GOWN DISPOSABLE) ×6 IMPLANT
KIT ABG SYR 3ML LUER SLIP (SYRINGE) IMPLANT
NEEDLE HYPO 25X5/8 SAFETYGLIDE (NEEDLE) IMPLANT
NS IRRIG 1000ML POUR BTL (IV SOLUTION) ×3 IMPLANT
PACK C SECTION WH (CUSTOM PROCEDURE TRAY) ×3 IMPLANT
PAD OB MATERNITY 4.3X12.25 (PERSONAL CARE ITEMS) ×3 IMPLANT
PENCIL SMOKE EVAC W/HOLSTER (ELECTROSURGICAL) ×3 IMPLANT
RTRCTR C-SECT PINK 25CM LRG (MISCELLANEOUS) ×3 IMPLANT
STRIP CLOSURE SKIN 1/2X4 (GAUZE/BANDAGES/DRESSINGS) ×2 IMPLANT
SUT MNCRL 0 VIOLET CTX 36 (SUTURE) ×2 IMPLANT
SUT MONOCRYL 0 CTX 36 (SUTURE) ×4
SUT PLAIN 1 NONE 54 (SUTURE) IMPLANT
SUT PLAIN 2 0 XLH (SUTURE) ×3 IMPLANT
SUT VIC AB 0 CT1 27 (SUTURE) ×4
SUT VIC AB 0 CT1 27XBRD ANBCTR (SUTURE) ×2 IMPLANT
SUT VIC AB 2-0 CT1 27 (SUTURE) ×2
SUT VIC AB 2-0 CT1 TAPERPNT 27 (SUTURE) ×1 IMPLANT
SUT VIC AB 4-0 KS 27 (SUTURE) ×3 IMPLANT
SYR BULB IRRIGATION 50ML (SYRINGE) ×3 IMPLANT
TOWEL OR 17X24 6PK STRL BLUE (TOWEL DISPOSABLE) ×3 IMPLANT
TRAY FOLEY CATH SILVER 14FR (SET/KITS/TRAYS/PACK) ×3 IMPLANT

## 2016-01-18 NOTE — Anesthesia Preprocedure Evaluation (Addendum)
Anesthesia Evaluation  Patient identified by MRN, date of birth, ID band Patient awake    Reviewed: Allergy & Precautions, NPO status , Patient's Chart, lab work & pertinent test results  Airway Mallampati: II  TM Distance: >3 FB Neck ROM: Full    Dental no notable dental hx.    Pulmonary asthma ,    Pulmonary exam normal breath sounds clear to auscultation       Cardiovascular Exercise Tolerance: Good negative cardio ROS Normal cardiovascular exam Rhythm:Regular Rate:Normal  ECHO 08-15-15:  H/O palpitations  Impressions:  - Normal LV size with EF 60-65%. Normal diastolic function. Normal  RV size and systolic function. No significant valvular  abnormalities. There was a trivial pericardial effusion noted  adjacent to the RV.    Neuro/Psych negative neurological ROS  negative psych ROS   GI/Hepatic negative GI ROS, Neg liver ROS,   Endo/Other  negative endocrine ROS  Renal/GU negative Renal ROS  negative genitourinary   Musculoskeletal negative musculoskeletal ROS (+)   Abdominal   Peds negative pediatric ROS (+)  Hematology  (+) anemia ,   Anesthesia Other Findings   Reproductive/Obstetrics negative OB ROS                           Anesthesia Physical Anesthesia Plan  ASA: II and emergent  Anesthesia Plan: Spinal   Post-op Pain Management:    Induction: Intravenous  Airway Management Planned: Natural Airway  Additional Equipment:   Intra-op Plan:   Post-operative Plan:   Informed Consent: I have reviewed the patients History and Physical, chart, labs and discussed the procedure including the risks, benefits and alternatives for the proposed anesthesia with the patient or authorized representative who has indicated his/her understanding and acceptance.   Dental advisory given  Plan Discussed with: CRNA and Surgeon  Anesthesia Plan Comments:         Anesthesia Quick Evaluation

## 2016-01-18 NOTE — H&P (Signed)
Sarah [name] is a 30 y.o. female G2P1001 at 38+ with SROM and ctx.  SROM confirmed in MAU.  SVE changed from 1.5 in office to 4cm.  D/W pt route of delivery, r/b/a of TOLAC vs rLTCS.  Questions answered, first c/s for breech.  Pt desires rLTCS.  Pt's PNC complicated by short cervix, treated with vaginal progesterone.  Normal genetic screening.  Normal glucola.  Dated by LMP cw w first trimester [place].  Maternal Medical History:  Reason for admission: Rupture of membranes and contractions.   Contractions: Frequency: regular.   Perceived severity is strong.    Fetal activity: Perceived fetal activity is normal.    Prenatal Complications - Diabetes: none.    OB History    Gravida Para Term Preterm AB TAB SAB Ectopic Multiple Living   G1 03/30/14, 6#13 female, breech G2 present  No abn pap, No STD  Past Medical History  Diagnosis Date  . Asthma   . Hx of varicella   . Partial hearing loss     R ear   Past Surgical History  Procedure Laterality Date  . Tonsillectomy    . Tympanostomy tube placement    . Cesarean section N/A 03/30/2014    Procedure: CESAREAN SECTION;  Surgeon: Lavina Hamman, MD;  Location: [facility];  Service: Obstetrics;  Laterality: N/A;   Family History: family history includes Cancer in her father, paternal uncle, and sister; Heart attack (age of onset: 25) in her paternal uncle; Heart attack (age of onset: 82) in her paternal grandfather; Hyperlipidemia in her mother; Hypertension in her father and mother; Thyroid disease in her father, paternal grandmother, and paternal uncle. Social History:  reports that she has never smoked. She has never used smokeless tobacco. She reports that she does not drink alcohol or use illicit drugs.married Meds PNV, h/o vag progesterone All latex, PCN   Prenatal Transfer Tool  Maternal Diabetes: No Genetic Screening: Normal Maternal Ultrasounds/Referrals: Normal Fetal Ultrasounds or other Referrals:   None Maternal Substance Abuse:  No Significant Maternal Medications:  None Significant Maternal Lab Results:  Lab values include: Group B Strep negative Other Comments:  short cervix - had vaginal progesterone  Review of Systems  Constitutional: Negative.   HENT: Negative.   Eyes: Negative.   Respiratory: Negative.   Cardiovascular: Negative.   Gastrointestinal: Negative.   Genitourinary: Negative.   Musculoskeletal: Negative.   Skin: Negative.   Neurological: Negative.   Psychiatric/Behavioral: Negative.     Dilation: 4 Effacement (%): 90 Station: -1 Exam by:: Orinda Kenner RN Blood pressure 110/69, pulse 82, temperature 97.9 F (36.6 C), temperature source Oral, resp. rate 20, height  (1.549 m), weight 63.532 kg (140 lb 1 oz), last menstrual period 04/24/2015, unknown if currently breastfeeding. Maternal Exam:  Uterine Assessment: Contraction strength is moderate.  Contraction frequency is regular.   Abdomen: Patient reports no abdominal tenderness. Surgical scars: low transverse.   Fundal height is appropriate for gestation.   Estimated fetal weight is 6.5-7.5#.   Fetal presentation: vertex  Introitus: Normal vulva. Normal vagina.  Cervix: Cervix evaluated by digital exam.     Physical Exam  Constitutional: She is oriented to person, place, and time. She appears well-developed and well-nourished.  Tearful and nervous  HENT:  Head: Normocephalic and atraumatic.  Cardiovascular: Normal rate and regular rhythm.   Respiratory: Effort normal and breath sounds normal. No respiratory distress. She has no wheezes.  GI: Soft. Bowel sounds are normal. She exhibits no distension. There is no tenderness.  Musculoskeletal: Normal range of motion.  Neurological: She is alert and oriented to person, place, and time.  Skin: Skin is warm and dry.  Psychiatric: She has a normal mood and affect. Her behavior is normal.    Prenatal labs: ABO, Rh: O/Positive/-- (09/29  0000) Antibody: Negative (09/29 0000) Rubella: Immune (09/29 0000) RPR: Nonreactive (09/29 0000)  HBsAg: Negative (09/29 0000)  HIV: Non-reactive (09/29 0000)  GBS: Negative (04/04 0000)   Tdap 2/16,   FFN neg x 2  Hgb 12.7/Plt 298/Ur Cx neg/GC neg/ Chl neg/nl first tri screen, WNL AFP/glucola 107 Nl anat, post plac, female  Assessment/Plan: 30yo G2P1001 at 38+ with SROM/labor Clinda/gent for prophylaxis D/w pt and FOB r/b/a of LTCS, will proceed    [name], Sarah [name] 01/18/2016, 6:32 AM

## 2016-01-18 NOTE — Lactation Note (Signed)
This note was copied from a baby's chart. Lactation Consultation Note  Patient Name: Boy Mable Fillmily Halberg RUEAV'WToday's Date: 01/18/2016 Reason for consult: Initial assessment Baby 8 hours old. Mom reports that she nursed first child for 1 year without any problems. Mom attempted to latch baby, but baby sleepy. Discussed normal newborn behavior and cluster-feeding. [facility] given [facility], aware of OP/BFSG and LC phone line assistance after D/C. Enc mom to offer lots of STS and nurse with cues.   Maternal Data Has patient been taught Hand Expression?: Yes Does the patient have breastfeeding experience prior to this delivery?: Yes  Feeding Feeding Type: Breast Fed Length of feed: 0 min  LATCH Score/Interventions Latch: Too sleepy or reluctant, no latch achieved, no sucking elicited. Intervention(s): Skin to skin;Waking techniques  Audible Swallowing: None Intervention(s): Skin to skin;Hand expression  Type of Nipple: Everted at rest and after stimulation  Comfort (Breast/Nipple): Soft / non-tender     Hold (Positioning): No assistance needed to correctly position infant at breast. Intervention(s): Support Pillows  LATCH Score: 6  Lactation Tools Discussed/Used     Consult Status Consult Status: Follow-up Date: 01/19/16 Follow-up type: In-patient    Geralynn OchsWILLIARD, Malaia Buchta 01/18/2016, 4:08 PM

## 2016-01-18 NOTE — Anesthesia Postprocedure Evaluation (Signed)
Anesthesia Post Note  Patient: Sarah [name]  Procedure(s) Performed: Procedure(s) (LRB): CESAREAN SECTION (N/A)  Patient location during evaluation: PACU Anesthesia Type: Spinal Level of consciousness: awake and alert and oriented Pain management: pain level controlled Vital Signs Assessment: post-procedure vital signs reviewed and stable Respiratory status: spontaneous breathing, nonlabored ventilation and respiratory function stable Cardiovascular status: blood pressure returned to baseline and stable Postop Assessment: no signs of nausea or vomiting, no headache, no backache, spinal receding and patient able to bend at knees Anesthetic complications: no     Last Vitals:  Filed Vitals:   01/18/16 0915 01/18/16 0930  BP: 97/56   Pulse: 93 68  Temp:    Resp: 19 20    Last Pain:  Filed Vitals:   01/18/16 0931  PainSc: 0-No pain   Pain Goal:                 Skylar Flynt A.

## 2016-01-18 NOTE — MAU Note (Signed)
[facility]. Care assumed.

## 2016-01-18 NOTE — Transfer of Care (Signed)
Immediate Anesthesia Transfer of Care Note  Patient: Sarah [name]  Procedure(s) Performed: Procedure(s): CESAREAN SECTION (N/A)  Patient Location: PACU  Anesthesia Type:Spinal  Level of Consciousness: awake, alert , oriented and patient cooperative  Airway & Oxygen Therapy: Patient Spontanous Breathing  Post-op Assessment: Report given to RN and Post -op Vital signs reviewed and stable  Post vital signs: Reviewed and stable  Last Vitals:  Filed Vitals:   01/18/16 0504  BP: 110/69  Pulse: 82  Temp: 36.6 C  Resp: 20    Last Pain:  Filed Vitals:   01/18/16 0839  PainSc: 7          Complications: No apparent anesthesia complications

## 2016-01-18 NOTE — Addendum Note (Signed)
Addendum  created 01/18/16 2055 by Elbert Ewingsolleen S Izear Pine, CRNA   Modules edited: Clinical Notes   Clinical Notes:  File: [id number]

## 2016-01-18 NOTE — Anesthesia Postprocedure Evaluation (Signed)
Anesthesia Post Note  Patient: Sarah [name]  Procedure(s) Performed: Procedure(s) (LRB): CESAREAN SECTION (N/A)  Patient location during evaluation: Mother Baby Anesthesia Type: Spinal Level of consciousness: awake, awake and alert and oriented Pain management: pain level controlled Vital Signs Assessment: vitals unstable Respiratory status: spontaneous breathing, nonlabored ventilation and respiratory function stable Cardiovascular status: stable Postop Assessment: no headache, no backache, patient able to bend at knees, no signs of nausea or vomiting and adequate PO intake Anesthetic complications: no     Last Vitals:  Filed Vitals:   01/18/16 1340 01/18/16 1730  BP: 105/54   Pulse: 67 71  Temp: 37.2 C 36.9 C  Resp: 18 18    Last Pain:  Filed Vitals:   01/18/16 1851  PainSc: 0-No pain   Pain Goal:                 Lyndsi Altic

## 2016-01-18 NOTE — MAU Note (Signed)
PT SAYS HURT BAD SINCE 0240   AND  THINKS  SROM   AT 0240.     DENIES HSV AND   MRSA.  GBS- NEG.   [facility]  RE[facility] C[facility]    [facility] 5-4.

## 2016-01-18 NOTE — Anesthesia Preprocedure Evaluation (Deleted)
Anesthesia Evaluation  Patient identified by MRN, date of birth, ID band Patient awake    Reviewed: Allergy & Precautions, NPO status , Patient's Chart, lab work & pertinent test results  Airway Mallampati: II  TM Distance: >3 FB Neck ROM: Full    Dental no notable dental hx.    Pulmonary asthma ,    Pulmonary exam normal breath sounds clear to auscultation       Cardiovascular Normal cardiovascular exam Rhythm:Regular Rate:Normal  H/O palpitations  ECHO 08-15-15: Impressions:  - Normal LV size with EF 60-65%. Normal diastolic function. Normal  RV size and systolic function. No significant valvular  abnormalities. There was a trivial pericardial effusion noted  adjacent to the RV.   Neuro/Psych negative neurological ROS  negative psych ROS   GI/Hepatic negative GI ROS, Neg liver ROS,   Endo/Other  negative endocrine ROS  Renal/GU negative Renal ROS  negative genitourinary   Musculoskeletal negative musculoskeletal ROS (+)   Abdominal   Peds negative pediatric ROS (+)  Hematology negative hematology ROS (+)   Anesthesia Other Findings   Reproductive/Obstetrics negative OB ROS                            Anesthesia Physical Anesthesia Plan  ASA: II  Anesthesia Plan: Spinal   Post-op Pain Management:    Induction: Intravenous  Airway Management Planned: Natural Airway  Additional Equipment:   Intra-op Plan:   Post-operative Plan:   Informed Consent: I have reviewed the patients History and Physical, chart, labs and discussed the procedure including the risks, benefits and alternatives for the proposed anesthesia with the patient or authorized representative who has indicated his/her understanding and acceptance.   Dental advisory given  Plan Discussed with: CRNA  Anesthesia Plan Comments:         Anesthesia Quick Evaluation

## 2016-01-18 NOTE — Progress Notes (Signed)
Patient transferred to mother/baby unit from PACU at 1030. Report received and initial assessment and teaching done.

## 2016-01-18 NOTE — Anesthesia Procedure Notes (Signed)
Spinal Patient location during procedure: OR Start time: 01/18/2016 7:41 AM Staffing Anesthesiologist: Mal AmabileFOSTER, Carly Sabo Performed by: anesthesiologist  Preanesthetic Checklist Completed: patient identified, site marked, surgical consent, pre-op evaluation, timeout performed, IV checked, risks and benefits discussed and monitors and equipment checked Spinal Block Patient position: sitting Prep: site prepped and draped and DuraPrep Patient monitoring: heart rate, cardiac monitor, continuous pulse ox and blood pressure Approach: midline Location: L4-5 Injection technique: single-shot Needle Needle type: Sprotte  Needle gauge: 24 G Needle length: 9 cm Needle insertion depth: 4.5 cm Assessment Sensory level: T4 Events: paresthesia Additional Notes Patient tolerated procedure well. Transient paresthesia right leg. Adequate sensory level.

## 2016-01-18 NOTE — Brief Op Note (Signed)
01/18/2016  8:48 AM  PATIENT:  Mable FillEmily Gratz  30 y.o. female  PRE-OPERATIVE DIAGNOSIS:  IUP at 38+, ROM, h/o LTCS, declines TOLAC  POST-OPERATIVE DIAGNOSIS:  Same, delivered  PROCEDURE:  Procedure(s): CESAREAN SECTION (N/A)  SURGEON:  Surgeon(s) and Role:    * Sherian ReinJody [name], MD - Primary  ASSISTANTS: friedlani, tina RNFA   FINDINGS: viable female infant at 8:01, apgars 9/9, wt P, nl PP anat  ANESTHESIA:   spinal  EBL:  Total I/O In: 2300 [I.V.:2300] Out: 800 [Urine:200; Blood:600]  BLOOD ADMINISTERED:none  DRAINS: Urinary Catheter (Foley)   LOCAL MEDICATIONS USED:  NONE  SPECIMEN:  Source of Specimen:  placenta  DISPOSITION OF SPECIMEN:  L&D  COUNTS:  YES  TOURNIQUET:  * No tourniquets in log *  DICTATION: .Other Dictation: Dictation Number [id number]  PLAN OF CARE: Admit to inpatient   PATIENT DISPOSITION:  PACU - hemodynamically stable.   Delay start of Pharmacological VTE agent (>24hrs) due to surgical blood loss or risk of bleeding: not applicable

## 2016-01-19 LAB — CBC
HEMATOCRIT: 25.8 % — AB (ref 36.0–46.0)
HEMOGLOBIN: 8.4 g/dL — AB (ref 12.0–15.0)
MCH: 27.9 pg (ref 26.0–34.0)
MCHC: 32.6 g/dL (ref 30.0–36.0)
MCV: 85.7 fL (ref 78.0–100.0)
Platelets: 265 10*3/uL (ref 150–400)
RBC: 3.01 MIL/uL — ABNORMAL LOW (ref 3.87–5.11)
RDW: 13.4 % (ref 11.5–15.5)
WBC: 11.3 10*3/uL — AB (ref 4.0–10.5)

## 2016-01-19 NOTE — Progress Notes (Signed)
Subjective: Postpartum Day #1: Cesarean Delivery Patient reports incisional pain, tolerating PO and no problems voiding.    Objective: Vital signs in last 24 hours: Temp:  [97.6 F (36.4 C)-99 F (37.2 C)] 98.6 F (37 C) (04/29 0558) Pulse Rate:  [55-93] 61 (04/29 0558) Resp:  [9-20] 16 (04/29 0558) BP: (96-124)/(43-104) 99/57 mmHg (04/29 0558) SpO2:  [97 %-99 %] 97 % (04/28 1730)  Physical Exam:  General: alert Lochia: appropriate Uterine Fundus: firm Incision: dressing C/D/I   Recent Labs  01/18/16 0635 01/19/16 0515  HGB 10.0* 8.4*  HCT 30.3* 25.8*    Assessment/Plan: Status post Cesarean section. Doing well postoperatively.  Continue current care.  Naji Mehringer D 01/19/2016, 8:51 AM

## 2016-01-19 NOTE — Lactation Note (Signed)
This note was copied from a baby's chart. Lactation Consultation Note  Patient Name: Sarah [name] ZOXWR'UToday's Date: 01/19/2016 Reason for consult: Follow-up assessment Baby at 33 hr of life and mom reports bf is going well. She requested a Harmony because she had "engorgement" when she got home with her older child and the manual worked better than the DEBP. She denied breast or nipple pain. She is aware of OP services and support group. She will call as needed for bf support.   Maternal Data    Feeding    LATCH Score/Interventions                      Lactation Tools Discussed/Used Pump Review: Setup, frequency, and cleaning;Milk Storage Initiated by:: ES Date initiated:: 01/19/16   Consult Status Consult Status: PRN    Rulon Eisenmengerlizabeth E Faruq Rosenberger 01/19/2016, 5:43 PM

## 2016-01-19 NOTE — Op Note (Signed)
NAMEKELCI, PETRELLA                ACCOUNT NO.:  [id number]  MEDICAL RECORD NO.:  [id number]  LOCATION:  [facility]                          FACILITY:  [facility]  PHYSICIAN:  Sherron Monday, MD        DATE OF BIRTH:  [birth year]  DATE OF PROCEDURE: DATE OF DISCHARGE:  01/18/2016                              OPERATIVE REPORT   PREOPERATIVE DIAGNOSES:  Intrauterine pregnancy at 38+ weeks, rupture of membranes, history of low-transverse cesarean section, declined trial of labor.  POSTPARTUM DIAGNOSES:  Intrauterien pregnancy at 38+ weeks, rupture of membranes, history of low-transverse cesarean section, declined trial of labor.  Delivered.  PROCEDURE:  Repeat low-transverse cesarean section.  ASSISTANT:  Baxter Flattery, RNFA.  FINDINGS:  Viable female infant at 8:01 a.m. with Apgars of 9 at 1 minute and 9 at 5 minutes.  Weight pending at the time of dictation.  Normal postpartum anatomy was noted with normal uterus, tubes, and ovaries.  ANESTHESIA:  Spinal.  ESTIMATED BLOOD LOSS:  Approximately 600 mL.  IV FLUIDS:  2300 mL.  URINE OUTPUT:  200 mL.  Clear urine at the end of the procedure.  COMPLICATIONS:  None.  PATHOLOGY:  Placenta to L and D.  PROCEDURE IN DETAIL:  After informed consent was reviewed with the patient, she was transported to the operating room where spinal anesthesia was placed.  She was then placed in the supine position with a leftward tilt.  Once it was found to be adequate after an appropriate time-out had been performed, she was prepped and draped in the normal sterile fashion.  Pfannenstiel skin incision was made at the level of her previous incision, carried through the underlying layer of fascia sharply.  The fascia incised in the midline.  The midline incision was extended laterally with Mayo scissors.  The superior aspect of the fascial incision was grasped with Kocher clamps elevating the rectus muscles, were dissected off both bluntly and sharply.  The midline  was easily identified and the peritoneum was entered bluntly.  The incision was extended superiorly and inferiorly with good visualization of the bladder.  The Alexis skin retractor was placed carefully making sure that no bowel was entrapped.  The uterus was visualized and explored. The vesicouterine peritoneum was visualized and a bladder flap was created with sharp and blunt dissection.  The uterus was incised in transverse fashion.  The infant was delivered from a vertex presentation with the aid of a vacuum.  Infant's mouth and nose were suctioned on the field.  Cord was awaited for a minute to be clamped and cut.  Infant was then handed off to awaiting pediatric staff.  The placenta was expressed.  Uterus was cleared of all clots and debris.  Uterine incision was closed with 2 layers of 0 Monocryl, the first of which was running locked and the second as an imbricating layer.  It was noted to be hemostatic.  The gutters were cleared of all clot and debris.  The peritoneum was reapproximated using 2-0 Vicryl.  The subfascial planes were inspected, found to be hemostatic and fascial incision was closed with 0 Vicryl in a running fashion with a single suture.  Subcuticular  adipose layer was made hemostatic and the dead space was closed with 3-0 plain gut.  The skin was closed with 4-0 Vicryl on a Keith needle. Benzoin and Steri-Strips applied.  The patient tolerated the procedure well.  Sponge, lap, and needle counts were correct x2 per the operating staff.     Sherron MondayJody Bovard, MD     JB/MEDQ  D:  01/18/2016  T:  01/18/2016  Job:  [id number]

## 2016-01-20 LAB — RPR: RPR: NONREACTIVE

## 2016-01-20 NOTE — Discharge Instructions (Signed)
As per discharge pamphlet °

## 2016-01-20 NOTE — Lactation Note (Signed)
This note was copied from a baby's chart. Lactation Consultation Note  Patient Name: Sarah Mable Fillmily Epple ONGEX'BToday's Date: 01/20/2016 Reason for consult: Follow-up assessment Mom reports some mild nipple tenderness. Request help to un-tuck lower lip. LC noted baby did not have good depth with latch. Assisted Mom with latching to obtain more depth with initial latch and demonstrated how to bring bottom lip down once latched. Reviewed positioning with newborn.  Mom reported less discomfort. Mom denies other questions/concerns. Encouraged to call for assist as needed.   Maternal Data    Feeding Feeding Type: Breast Fed Length of feed: 10 min  LATCH Score/Interventions Latch: Grasps breast easily, tongue down, lips flanged, rhythmical sucking.  Audible Swallowing: A few with stimulation  Type of Nipple: Everted at rest and after stimulation  Comfort (Breast/Nipple): Filling, red/small blisters or bruises, mild/mod discomfort  Problem noted: Mild/Moderate discomfort Interventions (Mild/moderate discomfort): Hand massage;Hand expression  Hold (Positioning): Assistance needed to correctly position infant at breast and maintain latch. Intervention(s): Breastfeeding basics reviewed;Support Pillows;Position options;Skin to skin  LATCH Score: 7  Lactation Tools Discussed/Used     Consult Status Consult Status: Follow-up Date: 01/21/16 Follow-up type: In-patient    Alfred LevinsGranger, Teo Moede Ann 01/20/2016, 8:09 PM

## 2016-01-20 NOTE — Progress Notes (Signed)
POD #2 Doing well, some pain Afeb, VSS Abd- soft, fundus firm, incision intact Continue routine care

## 2016-01-21 ENCOUNTER — Inpatient Hospital Stay (HOSPITAL_COMMUNITY)
Admission: RE | Admit: 2016-01-21 | Discharge: 2016-01-21 | Disposition: A | Discharge status: Home / Self Care | Payer: BC Managed Care – PPO | Source: Ambulatory Visit

## 2016-01-21 MED ORDER — OXYCODONE-ACETAMINOPHEN 5-325 MG PO TABS: 1.0000 | ORAL_TABLET | Freq: Four times a day (QID) | ORAL | Status: DC | PRN

## 2016-01-21 MED ORDER — PRENATAL MULTIVITAMIN CH: 1.0000 | ORAL_TABLET | Freq: Every day | ORAL | Status: DC

## 2016-01-21 MED ORDER — IBUPROFEN 800 MG PO TABS: 800.0000 mg | ORAL_TABLET | Freq: Three times a day (TID) | ORAL | Status: DC

## 2016-01-21 NOTE — Discharge Summary (Signed)
OB Discharge Summary     Patient Name: Sarah [name] DOB: September 15, 1986 MRN: [id number]  Date of admission: 01/18/2016 Delivering MD: Sherian Rein   Date of discharge: 01/21/2016  Admitting diagnosis: 38.4 WEEKS CTX Intrauterine pregnancy: [redacted]w[redacted]d     Secondary diagnosis:  Principal Problem:   S/P cesarean section  Additional problems: N/A     Discharge diagnosis: Term Pregnancy Delivered                                                                                                Post partum procedures:N/A  Augmentation: rLTCS  Complications: None  Hospital course:  Sceduled C/S   30 y.o. yo [id number] at [redacted]w[redacted]d was admitted to the hospital 01/18/2016 for scheduled cesarean section with the following indication:Elective Repeat. After SROM.  Membrane Rupture Time/Date: 2:30 AM ,01/18/2016   Patient delivered a Viable infant.01/18/2016  Details of operation can be found in separate operative note.  Pateint had an uncomplicated postpartum course.  She is ambulating, tolerating a regular diet, passing flatus, and urinating well. Patient is discharged home in stable condition on  01/21/2016          Physical exam  Filed Vitals:   01/19/16 1900 01/20/16 0600 01/20/16 1708 01/21/16 0639  BP: 95/63 111/66 103/63 112/74  Pulse: 94 59 71 65  Temp: 98.2 F (36.8 C) 98.2 F (36.8 C) 98.3 F (36.8 C) 97.9 F (36.6 C)  TempSrc: Oral Oral Oral Oral  Resp: Height:      Weight:      SpO2: 99%  98% 99%   General: alert and no distress Lochia: appropriate Uterine Fundus: firm Incision: Healing well with no significant drainage DVT Evaluation: No evidence of DVT seen on physical exam. Labs: Lab Results  Component Value Date   WBC 11.3* 01/19/2016   HGB 8.4* 01/19/2016   HCT 25.8* 01/19/2016   MCV 85.7 01/19/2016   PLT 265 01/19/2016   CMP Latest Ref Rng 08/01/2015  Glucose 65 - 99 mg/dL 86  BUN 6 - 20 mg/dL 9  Creatinine 1.47 - 8.29 mg/dL 5.62(Z)  Sodium 308 -  145 mmol/L 137  Potassium 3.5 - 5.1 mmol/L 4.0  Chloride 101 - 111 mmol/L 102  CO2 22 - 32 mmol/L 25  Calcium 8.9 - 10.3 mg/dL 9.4  Total Protein 6.5 - 8.1 g/dL 7.6  Total Bilirubin 0.3 - 1.2 mg/dL 0.3  Alkaline Phos 38 - 126 U/L 59  AST 15 - 41 U/L 21  ALT 14 - 54 U/L 12(L)    Discharge instruction: per After Visit Summary and "Baby and Me Booklet".  After visit meds:    Medication List    TAKE these medications        albuterol 108 (90 Base) MCG/ACT inhaler  Commonly known as:  PROVENTIL HFA;VENTOLIN HFA  Inhale 1-2 puffs into the lungs every 6 (six) hours as needed for wheezing or shortness of breath.     calcium carbonate 500 MG chewable tablet  Commonly known as:  TUMS - dosed in mg elemental calcium  Chew 1 tablet  by mouth 4 (four) times daily as needed for indigestion or heartburn.     ibuprofen 800 MG tablet  Commonly known as:  ADVIL,MOTRIN  Take 1 tablet (800 mg total) by mouth every 8 (eight) hours.     oxyCODONE-acetaminophen 5-325 MG tablet  Commonly known as:  PERCOCET/ROXICET  Take 1-2 tablets by mouth every 6 (six) hours as needed for severe pain.     prenatal multivitamin Tabs tablet  Take 1 tablet by mouth daily at 12 noon.        Diet: routine diet  Activity: Advance as tolerated. Pelvic rest for 6 weeks.   Outpatient follow up:2 and 6 weeks Follow up Appt:No future appointments. Follow up Visit:No Follow-up on file.  Postpartum contraception: Undecided  Newborn Data: Live born female  Birth Weight: 6 lb 13.9 oz (3115 g) APGAR: 9, 9  Baby Feeding: Breast Disposition:home with mother   01/21/2016 Sherian ReinBovard-Stuckert, Aailyah Dunbar, MD

## 2016-01-21 NOTE — Lactation Note (Signed)
This note was copied from a baby's chart. Lactation Consultation Note  Patient Name: Sarah [name] Reason for consult: Follow-up assessment;Other (Comment) (5% weight loss, @ 63 hours - 10.4 Bili )  Per mom breast feeding is going well and milk feels like it is in and the appearance of the milk has changed.  Per mom yesterday my nipples felt tender, LC helped with positioning and nipples are better.  LC reviewed doc flow sheets , noted to be WNL , latch scores 02-27-09-06-30-09- 7 .  Sore nipple and engorgement prevention and tx reviewed . Per mom already has a hand pump and familiar with use .  Mom mentioned she had an over supply with 1st baby, LC reviewed importance of keeping breast feeding simple is possible.  If full to start , hand express off the 1st breast ( small milk containers supplied) , 10-20 ml if over full , latch baby with depth and softened well ,  If the baby is still hungry offer 2nd breast to comfort with hand expressing or using the hand pump , save milk . These steps also will help with  Weight gain and assuring the baby weill consistently get to the creamy fat at each feeding.  Mother informed of post-discharge support and given phone number to the lactation department, including services for phone call assistance; out-patient appointments; and breastfeeding support group. List of other breastfeeding resources in the community given in the handout. Encouraged mother to call for problems or concerns related to breastfeeding.   Maternal Data Has patient been taught Hand Expression?:  (per mom feels comfortable )  Feeding Feeding Type:  (pe rmom last fed at 0845 am for 27 mins ) Length of feed: 27 min (per mom )  LATCH Score/Interventions Latch: Grasps breast easily, tongue down, lips flanged, rhythmical sucking.  Audible Swallowing: Spontaneous and intermittent  Type of Nipple: Everted at rest and after stimulation  Comfort (Breast/Nipple):  Soft / non-tender     Hold (Positioning): No assistance needed to correctly position infant at breast. Intervention(s): Breastfeeding basics reviewed  LATCH Score: 10  Lactation Tools Discussed/Used     Consult Status Consult Status: Complete Date: 01/21/16    Kathrin Greathouseorio, Keonna Raether [name], 10:22 AM

## 2016-01-21 NOTE — Progress Notes (Signed)
Subjective: Postpartum Day 3: Cesarean Delivery Patient reports incisional pain, tolerating PO and no problems voiding.    Objective: Vital signs in last 24 hours: Temp:  [97.9 F (36.6 C)-98.3 F (36.8 C)] 97.9 F (36.6 C) (05/01 0639) Pulse Rate:  [65-71] 65 (05/01 0639) Resp:  [16] 16 (05/01 0639) BP: (103-112)/(63-74) 112/74 mmHg (05/01 0639) SpO2:  [98 %-99 %] 99 % (05/01 [id number])  Physical Exam:  General: alert and no distress Lochia: appropriate Uterine Fundus: firm Incision: healing well DVT Evaluation: No evidence of DVT seen on physical exam.   Recent Labs  01/19/16 0515  HGB 8.4*  HCT 25.8*    Assessment/Plan: Status post Cesarean section. Doing well postoperatively.  Discharge home with standard precautions and return to clinic in 2 weeks.  D/C with Motrin, percocet, and PNV.  Routine PP care.    Sarah [name], Sarah [name] 01/21/2016, 7:37 AM

## 2016-01-22 ENCOUNTER — Encounter (HOSPITAL_COMMUNITY): Payer: Self-pay | Admitting: Obstetrics and Gynecology

## 2016-01-23 ENCOUNTER — Encounter (HOSPITAL_COMMUNITY)
Admission: RE | Admit: 2016-01-23 | Discharge: 2016-01-23 | Disposition: A | Discharge status: Home / Self Care | Payer: BC Managed Care – PPO | Source: Ambulatory Visit | Attending: Internal Medicine | Admitting: Internal Medicine

## 2016-01-24 ENCOUNTER — Inpatient Hospital Stay (HOSPITAL_COMMUNITY)
Admission: RE | Admit: 2016-01-24 | Payer: BC Managed Care – PPO | Source: Ambulatory Visit | Admitting: Obstetrics and Gynecology

## 2016-01-24 ENCOUNTER — Encounter (HOSPITAL_COMMUNITY): Admission: RE | Payer: Self-pay | Source: Ambulatory Visit

## 2016-01-24 SURGERY — Surgical Case
Anesthesia: Regional

## 2016-06-18 ENCOUNTER — Encounter (HOSPITAL_COMMUNITY): Payer: Self-pay | Admitting: *Deleted

## 2016-11-03 ENCOUNTER — Ambulatory Visit (INDEPENDENT_AMBULATORY_CARE_PROVIDER_SITE_OTHER): Payer: BC Managed Care – PPO | Admitting: Otolaryngology

## 2016-11-03 DIAGNOSIS — H9011 Conductive hearing loss, unilateral, right ear, with unrestricted hearing on the contralateral side: Secondary | ICD-10-CM

## 2016-11-03 DIAGNOSIS — H6981 Other specified disorders of Eustachian tube, right ear: Secondary | ICD-10-CM

## 2016-11-03 DIAGNOSIS — H9209 Otalgia, unspecified ear: Secondary | ICD-10-CM

## 2016-12-18 ENCOUNTER — Ambulatory Visit (INDEPENDENT_AMBULATORY_CARE_PROVIDER_SITE_OTHER): Payer: BC Managed Care – PPO | Admitting: Otolaryngology

## 2016-12-18 DIAGNOSIS — H6983 Other specified disorders of Eustachian tube, bilateral: Secondary | ICD-10-CM

## 2016-12-18 DIAGNOSIS — H900 Conductive hearing loss, bilateral: Secondary | ICD-10-CM

## 2017-03-16 ENCOUNTER — Ambulatory Visit (INDEPENDENT_AMBULATORY_CARE_PROVIDER_SITE_OTHER): Payer: BC Managed Care – PPO | Admitting: Otolaryngology

## 2017-03-16 DIAGNOSIS — H7201 Central perforation of tympanic membrane, right ear: Secondary | ICD-10-CM

## 2017-03-16 DIAGNOSIS — H6983 Other specified disorders of Eustachian tube, bilateral: Secondary | ICD-10-CM

## 2018-11-22 ENCOUNTER — Other Ambulatory Visit: Payer: Self-pay | Admitting: Orthopaedic Surgery

## 2018-11-22 DIAGNOSIS — M898X7 Other specified disorders of bone, ankle and foot: Secondary | ICD-10-CM

## 2018-11-28 ENCOUNTER — Ambulatory Visit
Admission: RE | Admit: 2018-11-28 | Discharge: 2018-11-28 | Disposition: A | Discharge status: Home / Self Care | Payer: BC Managed Care – PPO | Source: Ambulatory Visit | Attending: Orthopaedic Surgery | Admitting: Orthopaedic Surgery

## 2018-11-28 DIAGNOSIS — M898X7 Other specified disorders of bone, ankle and foot: Secondary | ICD-10-CM

## 2018-12-16 ENCOUNTER — Other Ambulatory Visit: Payer: Self-pay | Admitting: Orthopaedic Surgery

## 2019-01-11 ENCOUNTER — Ambulatory Visit (HOSPITAL_BASED_OUTPATIENT_CLINIC_OR_DEPARTMENT_OTHER)
Admission: RE | Admit: 2019-01-11 | Payer: BC Managed Care – PPO | Source: Home / Self Care | Admitting: Orthopaedic Surgery

## 2019-01-11 ENCOUNTER — Encounter (HOSPITAL_BASED_OUTPATIENT_CLINIC_OR_DEPARTMENT_OTHER): Admission: RE | Payer: Self-pay | Source: Home / Self Care

## 2019-01-11 SURGERY — EXCISION, SESAMOID BONE
Anesthesia: General | Laterality: Right

## 2019-01-26 ENCOUNTER — Other Ambulatory Visit: Payer: Self-pay | Admitting: Orthopaedic Surgery

## 2019-01-27 ENCOUNTER — Other Ambulatory Visit: Payer: Self-pay | Admitting: Orthopaedic Surgery

## 2019-01-28 ENCOUNTER — Other Ambulatory Visit: Payer: Self-pay

## 2019-01-28 ENCOUNTER — Encounter (HOSPITAL_BASED_OUTPATIENT_CLINIC_OR_DEPARTMENT_OTHER): Payer: Self-pay | Admitting: *Deleted

## 2019-01-31 ENCOUNTER — Other Ambulatory Visit (HOSPITAL_COMMUNITY): Payer: BC Managed Care – PPO

## 2019-02-01 ENCOUNTER — Other Ambulatory Visit (HOSPITAL_COMMUNITY)
Admission: RE | Admit: 2019-02-01 | Discharge: 2019-02-01 | Disposition: A | Discharge status: Home / Self Care | Payer: BC Managed Care – PPO | Source: Ambulatory Visit | Attending: Orthopaedic Surgery | Admitting: Orthopaedic Surgery

## 2019-02-01 ENCOUNTER — Other Ambulatory Visit: Payer: Self-pay

## 2019-02-01 DIAGNOSIS — Z1159 Encounter for screening for other viral diseases: Secondary | ICD-10-CM | POA: Diagnosis present

## 2019-02-01 NOTE — Progress Notes (Signed)
Ensure pre surgery drink given with instructions to complete by 0400 dos, pt verbalized understanding. 

## 2019-02-02 LAB — NOVEL CORONAVIRUS, NAA (HOSP ORDER, SEND-OUT TO REF LAB; TAT 18-24 HRS): SARS-CoV-2, NAA: NOT DETECTED

## 2019-02-03 ENCOUNTER — Encounter (HOSPITAL_BASED_OUTPATIENT_CLINIC_OR_DEPARTMENT_OTHER): Payer: Self-pay | Admitting: *Deleted

## 2019-02-03 ENCOUNTER — Encounter (HOSPITAL_BASED_OUTPATIENT_CLINIC_OR_DEPARTMENT_OTHER)
Admission: RE | Disposition: A | Discharge status: Home / Self Care | Payer: Self-pay | Source: Home / Self Care | Attending: Orthopaedic Surgery

## 2019-02-03 ENCOUNTER — Ambulatory Visit (HOSPITAL_BASED_OUTPATIENT_CLINIC_OR_DEPARTMENT_OTHER): Payer: BC Managed Care – PPO | Admitting: Anesthesiology

## 2019-02-03 ENCOUNTER — Other Ambulatory Visit: Payer: Self-pay

## 2019-02-03 ENCOUNTER — Ambulatory Visit (HOSPITAL_BASED_OUTPATIENT_CLINIC_OR_DEPARTMENT_OTHER)
Admission: RE | Admit: 2019-02-03 | Discharge: 2019-02-03 | Disposition: A | Discharge status: Home / Self Care | Payer: BC Managed Care – PPO | Attending: Orthopaedic Surgery | Admitting: Orthopaedic Surgery

## 2019-02-03 DIAGNOSIS — Z79899 Other long term (current) drug therapy: Secondary | ICD-10-CM | POA: Insufficient documentation

## 2019-02-03 DIAGNOSIS — M13871 Other specified arthritis, right ankle and foot: Secondary | ICD-10-CM | POA: Insufficient documentation

## 2019-02-03 DIAGNOSIS — J45909 Unspecified asthma, uncomplicated: Secondary | ICD-10-CM | POA: Diagnosis not present

## 2019-02-03 DIAGNOSIS — M25871 Other specified joint disorders, right ankle and foot: Secondary | ICD-10-CM | POA: Insufficient documentation

## 2019-02-03 DIAGNOSIS — R011 Cardiac murmur, unspecified: Secondary | ICD-10-CM | POA: Insufficient documentation

## 2019-02-03 HISTORY — DX: Cardiac murmur, unspecified: R01.1

## 2019-02-03 HISTORY — PX: SESAMOIDECTOMY: SHX6418

## 2019-02-03 LAB — POCT PREGNANCY, URINE: Preg Test, Ur: NEGATIVE

## 2019-02-03 SURGERY — Surgical Case
Anesthesia: *Unknown

## 2019-02-03 SURGERY — EXCISION, SESAMOID BONE
Anesthesia: General | Site: Foot | Laterality: Right

## 2019-02-03 MED ORDER — LACTATED RINGERS IV SOLN: INTRAVENOUS | Status: DC

## 2019-02-03 MED ORDER — ONDANSETRON HCL 4 MG/2ML IJ SOLN
INTRAMUSCULAR | Status: DC | PRN
  Administered 2019-02-03: 4 mg via INTRAVENOUS

## 2019-02-03 MED ORDER — FENTANYL CITRATE (PF) 100 MCG/2ML IJ SOLN: 25.0000 ug | INTRAMUSCULAR | Status: DC | PRN

## 2019-02-03 MED ORDER — SCOPOLAMINE 1 MG/3DAYS TD PT72: 1.0000 | MEDICATED_PATCH | Freq: Once | TRANSDERMAL | Status: DC | PRN

## 2019-02-03 MED ORDER — PROPOFOL 10 MG/ML IV BOLUS
INTRAVENOUS | Status: DC | PRN
  Administered 2019-02-03: 160 mg via INTRAVENOUS

## 2019-02-03 MED ORDER — CHLORHEXIDINE GLUCONATE 4 % EX LIQD: 60.0000 mL | Freq: Once | CUTANEOUS | Status: DC

## 2019-02-03 MED ORDER — DEXAMETHASONE SODIUM PHOSPHATE 10 MG/ML IJ SOLN
INTRAMUSCULAR | Status: AC
  Filled 2019-02-03: qty 1

## 2019-02-03 MED ORDER — METOCLOPRAMIDE HCL 5 MG/ML IJ SOLN: 10.0000 mg | Freq: Once | INTRAMUSCULAR | Status: DC | PRN

## 2019-02-03 MED ORDER — CLINDAMYCIN PHOSPHATE 900 MG/50ML IV SOLN
900.0000 mg | INTRAVENOUS | Status: AC
  Administered 2019-02-03: 900 mg via INTRAVENOUS

## 2019-02-03 MED ORDER — PROPOFOL 500 MG/50ML IV EMUL
INTRAVENOUS | Status: AC
  Filled 2019-02-03: qty 50

## 2019-02-03 MED ORDER — DEXAMETHASONE SODIUM PHOSPHATE 4 MG/ML IJ SOLN
INTRAMUSCULAR | Status: DC | PRN
  Administered 2019-02-03: 10 mg via INTRAVENOUS

## 2019-02-03 MED ORDER — LACTATED RINGERS IV SOLN
INTRAVENOUS | Status: DC
  Administered 2019-02-03: 07:00:00 via INTRAVENOUS

## 2019-02-03 MED ORDER — FENTANYL CITRATE (PF) 100 MCG/2ML IJ SOLN
INTRAMUSCULAR | Status: AC
  Filled 2019-02-03: qty 2

## 2019-02-03 MED ORDER — FENTANYL CITRATE (PF) 100 MCG/2ML IJ SOLN
50.0000 ug | INTRAMUSCULAR | Status: DC | PRN
  Administered 2019-02-03: 100 ug via INTRAVENOUS

## 2019-02-03 MED ORDER — LIDOCAINE 2% (20 MG/ML) 5 ML SYRINGE
INTRAMUSCULAR | Status: AC
  Filled 2019-02-03: qty 5

## 2019-02-03 MED ORDER — ONDANSETRON HCL 4 MG/2ML IJ SOLN: INTRAMUSCULAR | Status: DC | PRN

## 2019-02-03 MED ORDER — CLINDAMYCIN PHOSPHATE 900 MG/50ML IV SOLN
INTRAVENOUS | Status: AC
  Filled 2019-02-03: qty 50

## 2019-02-03 MED ORDER — MEPERIDINE HCL 25 MG/ML IJ SOLN: 6.2500 mg | INTRAMUSCULAR | Status: DC | PRN

## 2019-02-03 MED ORDER — MIDAZOLAM HCL 2 MG/2ML IJ SOLN
1.0000 mg | INTRAMUSCULAR | Status: DC | PRN
  Administered 2019-02-03: 08:00:00 2 mg via INTRAVENOUS

## 2019-02-03 MED ORDER — BUPIVACAINE HCL (PF) 0.5 % IJ SOLN
INTRAMUSCULAR | Status: AC
  Filled 2019-02-03: qty 30

## 2019-02-03 MED ORDER — ONDANSETRON HCL 4 MG/2ML IJ SOLN
INTRAMUSCULAR | Status: AC
  Filled 2019-02-03: qty 2

## 2019-02-03 MED ORDER — ONDANSETRON HCL 4 MG PO TABS: 4.0000 mg | ORAL_TABLET | Freq: Three times a day (TID) | ORAL | 1 refills | Status: AC | PRN

## 2019-02-03 MED ORDER — MIDAZOLAM HCL 2 MG/2ML IJ SOLN
INTRAMUSCULAR | Status: AC
  Filled 2019-02-03: qty 2

## 2019-02-03 MED ORDER — ROPIVACAINE HCL 5 MG/ML IJ SOLN
INTRAMUSCULAR | Status: DC | PRN
  Administered 2019-02-03 (×2): 20 mL via EPIDURAL

## 2019-02-03 MED ORDER — LIDOCAINE 2% (20 MG/ML) 5 ML SYRINGE
INTRAMUSCULAR | Status: DC | PRN
  Administered 2019-02-03: 40 mg via INTRAVENOUS

## 2019-02-03 MED ORDER — OXYCODONE HCL 5 MG PO TABS: 5.0000 mg | ORAL_TABLET | ORAL | 0 refills | Status: AC | PRN

## 2019-02-03 SURGICAL SUPPLY — 68 items
BANDAGE ACE 4X5 VEL STRL LF (GAUZE/BANDAGES/DRESSINGS) ×4 IMPLANT
BANDAGE ACE 6X5 VEL STRL LF (GAUZE/BANDAGES/DRESSINGS) IMPLANT
BANDAGE ESMARK 6X9 LF (GAUZE/BANDAGES/DRESSINGS) IMPLANT
BENZOIN TINCTURE PRP APPL 2/3 (GAUZE/BANDAGES/DRESSINGS) IMPLANT
BLADE AVERAGE 25MMX9MM (BLADE)
BLADE AVERAGE 25X9 (BLADE) IMPLANT
BLADE OSC/SAG .038X5.5 CUT EDG (BLADE) IMPLANT
BLADE SURG 15 STRL LF DISP TIS (BLADE) ×6 IMPLANT
BLADE SURG 15 STRL SS (BLADE) ×6
BNDG ESMARK 4X9 LF (GAUZE/BANDAGES/DRESSINGS) ×4 IMPLANT
BNDG ESMARK 6X9 LF (GAUZE/BANDAGES/DRESSINGS)
CHLORAPREP W/TINT 26 (MISCELLANEOUS) ×4 IMPLANT
CLOSURE WOUND 1/2 X4 (GAUZE/BANDAGES/DRESSINGS)
COVER BACK TABLE REUSABLE LG (DRAPES) ×4 IMPLANT
COVER WAND RF STERILE (DRAPES) IMPLANT
CUFF TOURN SGL QUICK 34 (TOURNIQUET CUFF) ×2
CUFF TRNQT CYL 34X4.125X (TOURNIQUET CUFF) ×2 IMPLANT
DECANTER SPIKE VIAL GLASS SM (MISCELLANEOUS) IMPLANT
DRAPE EXTREMITY T 121X128X90 (DISPOSABLE) ×4 IMPLANT
DRAPE IMP U-DRAPE 54X76 (DRAPES) ×4 IMPLANT
DRAPE OEC MINIVIEW 54X84 (DRAPES) ×4 IMPLANT
DRAPE U-SHAPE 47X51 STRL (DRAPES) ×4 IMPLANT
ELECT REM PT RETURN 9FT ADLT (ELECTROSURGICAL) ×4
ELECTRODE REM PT RTRN 9FT ADLT (ELECTROSURGICAL) ×2 IMPLANT
GAUZE SPONGE 4X4 12PLY STRL (GAUZE/BANDAGES/DRESSINGS) ×4 IMPLANT
GAUZE XEROFORM 1X8 LF (GAUZE/BANDAGES/DRESSINGS) ×4 IMPLANT
GLOVE BIO SURGEON STRL SZ7.5 (GLOVE) IMPLANT
GLOVE BIOGEL PI IND STRL 7.0 (GLOVE) ×2 IMPLANT
GLOVE BIOGEL PI IND STRL 8 (GLOVE) ×2 IMPLANT
GLOVE BIOGEL PI INDICATOR 7.0 (GLOVE) ×2
GLOVE BIOGEL PI INDICATOR 8 (GLOVE) ×2
GLOVE EXAM NITRILE MD LF STRL (GLOVE) ×4 IMPLANT
GLOVE SURG SS PI 7.5 STRL IVOR (GLOVE) ×4 IMPLANT
GLOVE SURG SYN 7.5  E (GLOVE) ×2
GLOVE SURG SYN 7.5 E (GLOVE) ×2 IMPLANT
GOWN STRL REUS W/ TWL LRG LVL3 (GOWN DISPOSABLE) ×2 IMPLANT
GOWN STRL REUS W/ TWL XL LVL3 (GOWN DISPOSABLE) ×4 IMPLANT
GOWN STRL REUS W/TWL LRG LVL3 (GOWN DISPOSABLE) ×2
GOWN STRL REUS W/TWL XL LVL3 (GOWN DISPOSABLE) ×4
NEEDLE HYPO 25X1 1.5 SAFETY (NEEDLE) IMPLANT
NS IRRIG 1000ML POUR BTL (IV SOLUTION) ×4 IMPLANT
PACK BASIN DAY SURGERY FS (CUSTOM PROCEDURE TRAY) ×4 IMPLANT
PAD CAST 4YDX4 CTTN HI CHSV (CAST SUPPLIES) IMPLANT
PADDING CAST COTTON 4X4 STRL (CAST SUPPLIES)
PADDING CAST SYNTHETIC 4 (CAST SUPPLIES)
PADDING CAST SYNTHETIC 4X4 STR (CAST SUPPLIES) IMPLANT
PENCIL BUTTON HOLSTER BLD 10FT (ELECTRODE) ×4 IMPLANT
SLEEVE SCD COMPRESS KNEE MED (MISCELLANEOUS) ×4 IMPLANT
SPONGE LAP 18X18 RF (DISPOSABLE) IMPLANT
STOCKINETTE 6  STRL (DRAPES) ×2
STOCKINETTE 6 STRL (DRAPES) ×2 IMPLANT
STRIP CLOSURE SKIN 1/2X4 (GAUZE/BANDAGES/DRESSINGS) IMPLANT
SUCTION FRAZIER HANDLE 10FR (MISCELLANEOUS) ×2
SUCTION TUBE FRAZIER 10FR DISP (MISCELLANEOUS) ×2 IMPLANT
SUT 0 FIBERLOOP 38 BLUE TPR ND (SUTURE) ×4
SUT BONE WAX W31G (SUTURE) IMPLANT
SUT ETHILON 3 0 PS 1 (SUTURE) ×4 IMPLANT
SUT MNCRL AB 3-0 PS2 18 (SUTURE) ×4 IMPLANT
SUT PDS AB 2-0 CT2 27 (SUTURE) IMPLANT
SUT VIC AB 0 SH 27 (SUTURE) ×4 IMPLANT
SUT VIC AB 3-0 FS2 27 (SUTURE) IMPLANT
SUTURE 0 FIBERLP 38 BLU TPR ND (SUTURE) ×2 IMPLANT
SYR BULB 3OZ (MISCELLANEOUS) ×4 IMPLANT
SYR CONTROL 10ML LL (SYRINGE) IMPLANT
TOWEL GREEN STERILE FF (TOWEL DISPOSABLE) ×8 IMPLANT
TUBE CONNECTING 20'X1/4 (TUBING) ×1
TUBE CONNECTING 20X1/4 (TUBING) ×3 IMPLANT
UNDERPAD 30X30 (UNDERPADS AND DIAPERS) ×4 IMPLANT

## 2019-02-03 NOTE — Anesthesia Procedure Notes (Signed)
Anesthesia Regional Block: Popliteal block   Pre-Anesthetic Checklist: ,, timeout performed, Correct Patient, Correct Site, Correct Laterality, Correct Procedure, Correct Position, site marked, Risks and benefits discussed,  Surgical consent,  Pre-op evaluation,  At surgeon's request and post-op pain management  Laterality: Right and Lower  Prep: Maximum Sterile Barrier Precautions used, chloraprep       Needles:  Injection technique: Single-shot  Needle Type: Echogenic Stimulator Needle     Needle Length: 10cm      Additional Needles:   Procedures:,,,, ultrasound used (permanent image in chart),,,,  Narrative:  Start time: 02/03/2019 7:00 AM End time: 02/03/2019 7:05 AM Injection made incrementally with aspirations every 5 mL.  Performed by: Personally  Anesthesiologist: Phillips Grout, MD  Additional Notes: Risks, benefits and alternative to block explained extensively.  Patient tolerated procedure well, without complications.

## 2019-02-03 NOTE — Transfer of Care (Signed)
Immediate Anesthesia Transfer of Care Note  Patient: Sarah [name]  Procedure(s) Performed: RIGHT MEDIAL SESAMOIDECTOMY (Right Foot)  Patient Location: PACU  Anesthesia Type:General and Regional  Level of Consciousness: awake and sedated  Airway & Oxygen Therapy: Patient Spontanous Breathing and Patient connected to nasal cannula oxygen  Post-op Assessment: Report given to RN and Post -op Vital signs reviewed and stable  Post vital signs: Reviewed and stable  Last Vitals:  Vitals Value Taken Time  BP 92/49 02/03/2019  9:04 AM  Temp    Pulse 62 02/03/2019  9:05 AM  Resp 7 02/03/2019  9:05 AM  SpO2 100 % 02/03/2019  9:05 AM  Vitals shown include unvalidated device data.  Last Pain:  Vitals:   02/03/19 2902  TempSrc: Tympanic  PainSc: 0-No pain         Complications: No apparent anesthesia complications

## 2019-02-03 NOTE — Discharge Instructions (Signed)
DR. Susa Simmonds FOOT & ANKLE SURGERY POST-OP INSTRUCTIONS   Pain Management 1. The numbing medicine and your leg will last around 8 hours, take a dose of your pain medicine as soon as you feel it wearing off to avoid rebound pain. 2. Keep your foot elevated above heart level.  Make sure that your heel hangs free ('floats'). 3. Take all prescribed medication as directed. 4. If taking narcotic pain medication you may want to use an over-the-counter stool softener to avoid constipation. 5. You may take over-the-counter NSAIDs (ibuprofen, naproxen, etc.) as well as over-the-counter acetaminophen as directed on the packaging as a supplement for your pain and may also use it to wean away from the prescription medication.  Activity ? Weightbearing as tolerated on heel in post operative shoe  First Postoperative Visit 1. Your first postop visit will be at least 2 weeks after surgery.  This should be scheduled when you schedule surgery. 2. If you do not have a postoperative visit scheduled please call [phone] to schedule an appointment. 3. At the appointment your incision will be evaluated for suture removal, x-rays will be obtained if necessary.  General Instructions 1. Swelling is very common after foot and ankle surgery.  It often takes 3 months for the foot and ankle to begin to feel comfortable.  Some amount of swelling will persist for 6-12 months. 2. DO NOT change the dressing.  If there is a problem with the dressing (too tight, loose, gets wet, etc.) please contact Dr. Donnie Mesa office. 3. DO NOT get the dressing wet.  For showers you can use an over-the-counter cast cover or wrap a washcloth around the top of your dressing and then cover it with a plastic bag and tape it to your leg. 4. DO NOT soak the incision (no tubs, pools, bath, etc.) until you have approval from Dr. Susa Simmonds.  Contact Dr. Garret Reddish office or go to Emergency Room if: 1. Temperature above 101 F. 2. Increasing pain that is  unresponsive to pain medication or elevation 3. Excessive redness or swelling in your foot 4. Dressing problems - excessive bloody drainage, looseness or tightness, or if dressing gets wet 5. Develop pain, swelling, warmth, or discoloration of your calf   Post Anesthesia Home Care Instructions  Activity: Get plenty of rest for the remainder of the day. A responsible individual must stay with you for 24 hours following the procedure.  For the next 24 hours, DO NOT: -Drive a car -Advertising copywriter -Drink alcoholic beverages -Take any medication unless instructed by your physician -Make any legal decisions or sign important papers.  Meals: Start with liquid foods such as gelatin or soup. Progress to regular foods as tolerated. Avoid greasy, spicy, heavy foods. If nausea and/or vomiting occur, drink only clear liquids until the nausea and/or vomiting subsides. Call your physician if vomiting continues.  Special Instructions/Symptoms: Your throat may feel dry or sore from the anesthesia or the breathing tube placed in your throat during surgery. If this causes discomfort, gargle with warm salt water. The discomfort should disappear within 24 hours.  If you had a scopolamine patch placed behind your ear for the management of post- operative nausea and/or vomiting:  1. The medication in the patch is effective for 72 hours, after which it should be removed.  Wrap patch in a tissue and discard in the trash. Wash hands thoroughly with soap and water. 2. You may remove the patch earlier than 72 hours if you experience unpleasant side effects which may  include dry mouth, dizziness or visual disturbances. 3. Avoid touching the patch. Wash your hands with soap and water after contact with the patch.    Regional Anesthesia Blocks  1. Numbness or the inability to move the "blocked" extremity may last from 3-48 hours after placement. The length of time depends on the medication injected and your  individual response to the medication. If the numbness is not going away after 48 hours, call your surgeon.  2. The extremity that is blocked will need to be protected until the numbness is gone and the  Strength has returned. Because you cannot feel it, you will need to take extra care to avoid injury. Because it may be weak, you may have difficulty moving it or using it. You may not know what position it is in without looking at it while the block is in effect.  3. For blocks in the legs and feet, returning to weight bearing and walking needs to be done carefully. You will need to wait until the numbness is entirely gone and the strength has returned. You should be able to move your leg and foot normally before you try and bear weight or walk. You will need someone to be with you when you first try to ensure you do not fall and possibly risk injury.  4. Bruising and tenderness at the needle site are common side effects and will resolve in a few days.  5. Persistent numbness or new problems with movement should be communicated to the surgeon or the [facility] [phone])/ [facility] [phone]).

## 2019-02-03 NOTE — Op Note (Signed)
Mable Fillmily Coppens female 33 y.o. 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 female has been dealing with painful plantar first MTP pain for over a year.  She was recalcitrant to conservative treatment the form of postoperative shoe wear, boot immobilization, physical therapy, anti-inflammatories, as well as activity modifications.  She continued to have significant bout of pain that was limiting her activities and ability to continue exercising.  MRI was obtained that demonstrated some edema and fragmentation of the medial sesamoid.  There was also evidence of some subchondral cystic changes within the lateral sesamoidal articulation about the first metatarsal head.  She was not having much pain in this area and therefore was indicated for right medial sesamoidectomy given that she failed conservative treatment.  We discussed the risk, benefits and alternatives of surgery which included but were not limited to wound healing complications, infection, need for further surgery, damage surrounding structures, cock-up toe deformity versus hallux valgus deformity.  She weighed these risks and wished to proceed.  We also discussed the perioperative and anesthetic risk which include death.  She understood the weightbearing restrictions postoperatively and agreed to comply.  PROCEDURE: Patient was identified in the preoperative holding area.  The right foot was marked by myself.  Consent was signed myself and the patient.  Anesthesia performed peripheral nerve blocks prior to going to the operating room.  She was then taken the  operating room placed supine the operative table.  All bony prominences well-padded.  General LMA anesthesia was induced without difficulty.  Bone foam was used.  A thigh tourniquet was placed but not used throughout the case.  The right lower extremity was prepped and draped in the usual sterile fashion.  A timeout was performed. A 4 inch esmarch touriquette was placed. We began by making a longitudinal incision overlying the plantar aspect of the first MTP joint medially.  This was taken sharply down through skin and subcutaneous tissue.  Then blunt dissection was used to identify the plantar medial sensory nerve that was protected through the entirety of the case.  Then the abductor hallucis tendon was identified and an separate incision was made dorsal to this through the capsular tissue.  The medial sesamoid was identified and there is a large rush of synovial fluid.  There is evidence of inflamed synovial tissue surrounding the sesamoid and there was fragmentation.  The sesamoid was removed sharply with 15 blade.  It was inspected and found to be fragmented and there was some mobility between the poles distally and proximally.  Then the synovial tissue that remained within the articulation was removed sharply with 15 blade and excised.  X-ray fluoroscopy was used to confirm complete sesamoid excision.  Then using plantar flexion about the first MTP joint and a Therapist, nutritionalreer elevator is able to inspect the lateral sesamoid articulation.  There was no evidence of chondral damage to the dorsal aspect of the sesamoid and the articulation.  There is no soft areas of cartilage within the metatarsal head articulation portion.  Then the proximal aspect of the extensor digitorum brevis muscle and tendon was mobilized and tagged with a 0 FiberWire stitch.  Then this was repaired to the distal portion of the tendon at the  proximal phalanx.  This held the toe in a good plantarflexed position and was without any valgus or varus  deformity.  Then the capsular tissue was repaired meticulously with a 0 PDS suture.  We incorporated the dorsal aspect abductor pollicis tendon into the capsular repair.  Then the subcuticular tissue was closed with 3-0 Monocryl and the skin with 3-0 nylon.  There was good tension in a slightly plantarflexed postion on the hallux after closure.  We then released the tourniquet.   Soft dressing was placed.  The hallux was held in a plantarflexed position with the cast padding and Ace wrap.  Counts were correct in the case.  There were no complications.  She was awakened from anesthesia and taken recovery room in stable condition.  POST OPERATIVE INSTRUCTIONS: Weightbearing as tolerated on the heel in a postoperative shoe avoid hallux extension Keep limb elevated No need for DVT prophylaxis in this ambulatory patient Call the office with concerns She will follow-up in 2 weeks for dressing removal, x-rays and suture removal.   BLOOD LOSS:  Minimal         DRAINS: none         SPECIMEN: none       COMPLICATIONS:  * No complications entered in OR log *         Disposition: PACU - hemodynamically stable.         Condition: stable

## 2019-02-03 NOTE — Anesthesia Postprocedure Evaluation (Signed)
Anesthesia Post Note  Patient: Sarah [name]  Procedure(s) Performed: RIGHT MEDIAL SESAMOIDECTOMY (Right Foot)     Patient location during evaluation: PACU Anesthesia Type: General Level of consciousness: awake and alert Pain management: pain level controlled Vital Signs Assessment: post-procedure vital signs reviewed and stable Respiratory status: spontaneous breathing, nonlabored ventilation, respiratory function stable and patient connected to nasal cannula oxygen Cardiovascular status: blood pressure returned to baseline and stable Postop Assessment: no apparent nausea or vomiting Anesthetic complications: no    Last Vitals:  Vitals:   02/03/19 0946 02/03/19 1000  BP: 99/67 97/66  Pulse:  67  Resp:  16  Temp:  36.6 C  SpO2:  100%    Last Pain:  Vitals:   02/03/19 1000  TempSrc:   PainSc: 0-No pain                 Phillips Grout

## 2019-02-03 NOTE — Anesthesia Procedure Notes (Signed)
Anesthesia Regional Block: Adductor canal block   Pre-Anesthetic Checklist: ,, timeout performed, Correct Patient, Correct Site, Correct Laterality, Correct Procedure, Correct Position, site marked, Risks and benefits discussed,  Surgical consent,  Pre-op evaluation,  At surgeon's request and post-op pain management  Laterality: Right and Lower  Prep: Maximum Sterile Barrier Precautions used, chloraprep       Needles:  Injection technique: Single-shot  Needle Type: Echogenic Stimulator Needle     Needle Length: 10cm      Additional Needles:   Procedures:,,,, ultrasound used (permanent image in chart),,,,  Narrative:  Start time: 02/03/2019 7:06 AM End time: 02/03/2019 7:09 AM Injection made incrementally with aspirations every 5 mL.  Performed by: Personally  Anesthesiologist: Phillips Grout, MD  Additional Notes: Risks, benefits and alternative to block explained extensively.  Patient tolerated procedure well, without complications.

## 2019-02-03 NOTE — Progress Notes (Signed)
Assisted Dr. Carignan with right, ultrasound guided, popliteal/saphenous block. Side rails up, monitors on throughout procedure. See vital signs in flow sheet. Tolerated Procedure well. 

## 2019-02-03 NOTE — Anesthesia Procedure Notes (Signed)
Procedure Name: LMA Insertion Performed by: Anan Dapolito W, CRNA Pre-anesthesia Checklist: Patient identified, Emergency Drugs available, Suction available and Patient being monitored Patient Re-evaluated:Patient Re-evaluated prior to induction Oxygen Delivery Method: Circle system utilized Preoxygenation: Pre-oxygenation with 100% oxygen Induction Type: IV induction Ventilation: Mask ventilation without difficulty LMA: LMA inserted LMA Size: 4.0 Number of attempts: 1 Placement Confirmation: positive ETCO2 Tube secured with: Tape Dental Injury: Teeth and Oropharynx as per pre-operative assessment        

## 2019-02-03 NOTE — H&P (Signed)
Sarah [name] is an 33 y.o. female.   Chief Complaint: Right medial sesamoiditis, lateral sesamoid articulation arthritis HPI: Sarah [name] has been dealing with pain on the plantar aspect of her right first MTP joint for several months.  She saw me in the office and underwent rounds of boot immobilization, postoperative shoe wear, activity modifications, anti-inflammatories as well as icing and was recalcitrant to this treatment.  She had continued pain that was limiting her activities.  She is here today for medial sesamoidectomy after MRI revealed evidence of sesamoid fragmentation and edema.  The MRI was also notable for some degenerative changes seen in the lateral sesamoid articulation about the first metatarsal head.  Patient has not had any recent fevers or chills.  She continues to have pain.  Her surgery was a little bit delayed due to the coronavirus and surgical restrictions but she continues to have significant amount of pain and wishes to proceed with surgery.  Past Medical History:  Diagnosis Date  . Asthma   . Heart murmur   . Hx of varicella   . Partial hearing loss    R ear  . S/P cesarean section 01/18/2016    Past Surgical History:  Procedure Laterality Date  . CESAREAN SECTION N/A 03/30/2014   Procedure: CESAREAN SECTION;  Surgeon: Lavina Hammanodd Meisinger, MD;  Location: [facility];  Service: Obstetrics;  Laterality: N/A;  . CESAREAN SECTION N/A 01/18/2016   Procedure: CESAREAN SECTION;  Surgeon: Sherian ReinJody [name], MD;  Location: [facility];  Service: Obstetrics;  Laterality: N/A;  . TONSILLECTOMY    . TYMPANOSTOMY TUBE PLACEMENT      Family History  Problem Relation Age of Onset  . Hypertension Mother   . Hyperlipidemia Mother   . Hypertension Father   . Cancer Father   . Thyroid disease Father   . Cancer Paternal Uncle   . Thyroid disease Paternal Uncle   . Cancer Sister   . Heart attack Paternal Grandfather [phone]       Deceased  . Heart attack Paternal Uncle [phone]  . Thyroid disease  Paternal Grandmother    Social History:  reports that she has never smoked. She has never used smokeless tobacco. She reports that she does not drink alcohol or use drugs.  Allergies:  Allergies  Allergen Reactions  . Latex Rash  . Penicillins Anxiety    Has patient had a PCN reaction causing immediate rash, facial/tongue/throat swelling, SOB or lightheadedness with hypotension: No Has patient had a PCN reaction causing severe rash involving mucus membranes or skin necrosis: No Has patient had a PCN reaction that required hospitalization No Has patient had a PCN reaction occurring within the last 10 years: No If all of the above answers are "NO", then may proceed with Cephalosporin use.     Medications Prior to Admission  Medication Sig Dispense Refill  . Cholecalciferol (VITAMIN D3) 50 MCG (2000 UT) TABS Take by mouth.    Marland Kitchen. albuterol (PROVENTIL HFA;[name] 108 (90 BASE) MCG/ACT inhaler Inhale 1-2 puffs into the lungs every 6 (six) hours as needed for wheezing or shortness of breath.     . clonazePAM (KLONOPIN) 0.5 MG tablet Take 0.5 mg by mouth 2 (two) times daily as needed for anxiety.    . meloxicam (MOBIC) 7.5 MG tablet Take 7.5 mg by mouth daily.    Marland Kitchen. PARAGARD INTRAUTERINE COPPER IU by Intrauterine route.      Results for orders placed or performed during the hospital encounter of 02/03/19 (from the past 48  hour(s))  Pregnancy, urine POC     Status: None   Collection Time: 02/03/19  6:19 AM  Result Value Ref Range   Preg Test, Ur NEGATIVE NEGATIVE    Comment:        THE SENSITIVITY OF THIS METHODOLOGY IS >24 mIU/mL    No results found.  Review of Systems  Constitutional: Negative.   HENT: Negative.   Eyes: Negative.   Respiratory: Negative.   Cardiovascular: Negative.   Gastrointestinal: Negative.   Musculoskeletal:       Pain in right foot under big toe joint.   Skin: Negative.   Neurological: Negative.   Psychiatric/Behavioral: Negative.     Blood  pressure 113/78, pulse 79, temperature 98.5 F (36.9 C), temperature source Tympanic, resp. rate 12, height 5\' 2"  (1.575 m), weight 57.4 kg, last menstrual period 01/13/2019, SpO2 100 %, unknown if currently breastfeeding. Physical Exam  Constitutional: She appears well-developed.  HENT:  Head: Normocephalic.  Eyes: Conjunctivae are normal.  Neck: Neck supple.  Cardiovascular: Normal rate.  Respiratory: Effort normal.  GI: Soft.  Musculoskeletal:     Comments: Right foot demonstrates some tenderness to palpation over the medial sesamoid on the plantar aspect of the first MTP joint.  There is some swelling there.  Mild discomfort more lateral to there to deep palpation.  No pain with hallux MTP motion.  No tenderness to palpation under the lesser toe MTP joints or plantar forefoot laterally.  No tenderness palpation about the dorsal midfoot or ankle.  She has active ankle dorsiflexion plantarflexion as well as hindfoot inversion and eversion with full strength and without pain.  Sensation intact in the superficial peroneal nerve, deep peroneal nerve, tibial nerve distributions.  Palpable dorsalis pedis pulse.  Neurological: She is alert.  Skin: Skin is warm.  Psychiatric: She has a normal mood and affect.     Assessment/Plan We will proceed with planned right medial sesamoidectomy.  I will attempt to look at the lateral sesamoid articulation at the time of surgery to see if there are any loose pieces of cartilage or obvious degeneration.  If there is we will attempt to debride these.  We did discuss today in the preoperative holding area that after a medial sesamoidectomy there is a chance that she could develop pain in the lateral sesamoid articulation due to these degenerative changes but that we cannot remove both sesamoids.  She understands this risk and wishes to proceed.  We also discussed other risks which include but are not limited to wound healing complications, infection, toe deformity,  need for further surgery, damage surrounding structures and other complications.  We also discussed the perioperative and anesthetic risk which include death.  She understands the postoperative weightbearing restrictions and agrees to comply.  I discussed the importance of holding the toe still during the healing process to reduce the risk of stretching out the flexor hallucis brevis tendon repair and any resultant toe deformities.  She wishes to proceed with surgery.  Terance Hart, MD 02/03/2019, 7:09 AM

## 2019-02-03 NOTE — Anesthesia Preprocedure Evaluation (Signed)
Anesthesia Evaluation  Patient identified by MRN, date of birth, ID band Patient awake    Reviewed: Allergy & Precautions, NPO status , Patient's Chart, lab work & pertinent test results  Airway Mallampati: II  TM Distance: >3 FB Neck ROM: Full    Dental no notable dental hx.    Pulmonary asthma ,    Pulmonary exam normal breath sounds clear to auscultation       Cardiovascular negative cardio ROS Normal cardiovascular exam Rhythm:Regular Rate:Normal     Neuro/Psych negative neurological ROS  negative psych ROS   GI/Hepatic negative GI ROS, Neg liver ROS,   Endo/Other  negative endocrine ROS  Renal/GU negative Renal ROS  negative genitourinary   Musculoskeletal negative musculoskeletal ROS (+)   Abdominal   Peds negative pediatric ROS (+)  Hematology negative hematology ROS (+)   Anesthesia Other Findings   Reproductive/Obstetrics negative OB ROS                             Anesthesia Physical Anesthesia Plan  ASA: II  Anesthesia Plan: General   Post-op Pain Management: GA combined w/ Regional for post-op pain   Induction: Intravenous  PONV Risk Score and Plan: 3 and Ondansetron, Dexamethasone, Treatment may vary due to age or medical condition and Midazolam  Airway Management Planned: LMA and Oral ETT  Additional Equipment:   Intra-op Plan:   Post-operative Plan: Extubation in OR  Informed Consent: I have reviewed the patients History and Physical, chart, labs and discussed the procedure including the risks, benefits and alternatives for the proposed anesthesia with the patient or authorized representative who has indicated his/her understanding and acceptance.     Dental advisory given  Plan Discussed with: CRNA  Anesthesia Plan Comments:         Anesthesia Quick Evaluation

## 2019-02-04 ENCOUNTER — Encounter (HOSPITAL_BASED_OUTPATIENT_CLINIC_OR_DEPARTMENT_OTHER): Payer: Self-pay | Admitting: Orthopaedic Surgery

## 2019-06-30 ENCOUNTER — Ambulatory Visit (INDEPENDENT_AMBULATORY_CARE_PROVIDER_SITE_OTHER): Payer: BC Managed Care – PPO | Admitting: Otolaryngology

## 2019-06-30 ENCOUNTER — Other Ambulatory Visit: Payer: Self-pay

## 2019-06-30 DIAGNOSIS — H9011 Conductive hearing loss, unilateral, right ear, with unrestricted hearing on the contralateral side: Secondary | ICD-10-CM

## 2019-06-30 DIAGNOSIS — H6981 Other specified disorders of Eustachian tube, right ear: Secondary | ICD-10-CM | POA: Diagnosis not present

## 2019-10-31 ENCOUNTER — Other Ambulatory Visit: Payer: Self-pay

## 2019-10-31 ENCOUNTER — Ambulatory Visit: Payer: BC Managed Care – PPO | Attending: Internal Medicine

## 2019-10-31 DIAGNOSIS — Z20822 Contact with and (suspected) exposure to covid-19: Secondary | ICD-10-CM

## 2019-11-01 LAB — NOVEL CORONAVIRUS, NAA: SARS-CoV-2, NAA: NOT DETECTED

## 2019-11-19 ENCOUNTER — Ambulatory Visit: Payer: BC Managed Care – PPO | Attending: Internal Medicine

## 2019-11-19 DIAGNOSIS — Z23 Encounter for immunization: Secondary | ICD-10-CM | POA: Insufficient documentation

## 2019-11-19 NOTE — Progress Notes (Signed)
   Covid-19 Vaccination Clinic  Name:  Kahliyah Dick    MRN: [id number] DOB: 08/18/1986  11/19/2019  Ms. Brines was observed post Covid-19 immunization for 15 minutes without incidence. She was provided with Vaccine Information Sheet and instruction to access the V-Safe system.   Ms. Monsivais was instructed to call 911 with any severe reactions post vaccine: Marland Kitchen Difficulty breathing  . Swelling of your face and throat  . A fast heartbeat  . A bad rash all over your body  . Dizziness and weakness    Immunizations Administered    Name Date Dose VIS Date Route   Moderna COVID-19 Vaccine 11/19/2019  1:23 PM 0.5 mL 08/23/2019 Intramuscular   Manufacturer: Moderna   Lot: [id number]   NDC: [id number]

## 2019-12-17 ENCOUNTER — Ambulatory Visit: Payer: BC Managed Care – PPO | Attending: Internal Medicine

## 2019-12-17 DIAGNOSIS — Z23 Encounter for immunization: Secondary | ICD-10-CM

## 2019-12-17 NOTE — Progress Notes (Signed)
   Covid-19 Vaccination Clinic  Name:  Adonis Ryther    MRN: [id number] DOB: June 20, 1986  12/17/2019  Ms. Amparo was observed post Covid-19 immunization for 15 minutes without incident. She was provided with Vaccine Information Sheet and instruction to access the V-Safe system.   Ms. Butner was instructed to call 911 with any severe reactions post vaccine: Marland Kitchen Difficulty breathing  . Swelling of face and throat  . A fast heartbeat  . A bad rash all over body  . Dizziness and weakness   Immunizations Administered    Name Date Dose VIS Date Route   Moderna COVID-19 Vaccine 12/17/2019  9:58 AM 0.5 mL 08/23/2019 Intramuscular   Manufacturer: Gala Murdoch   Lot: [id number]   NDC: 10315-945-85

## 2020-02-28 ENCOUNTER — Other Ambulatory Visit: Payer: BC Managed Care – PPO

## 2020-03-14 IMAGING — MR MRI OF THE RIGHT FOREFOOT WITHOUT CONTRAST
5 series · 40 of 40 positions shown · non-contrast
Comparison: Plain films right foot 03/19/2007.

CLINICAL DATA: Pain about the right first MTP joint for 1 year. No
known injury.

EXAM:
MRI OF THE RIGHT FOREFOOT WITHOUT CONTRAST
TECHNIQUE: Multiplanar, multisequence MR imaging of the right forefoot. Was
performed. No intravenous contrast was administered.

[Series 5: T1 · coronal · left · 3.0mm · 0.31mm/px · 12 of 40 slices shown (1 of 2)]
[im 1/40]
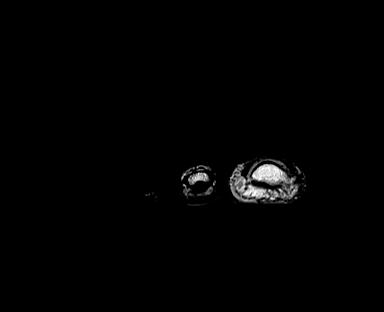
[im 4/40]
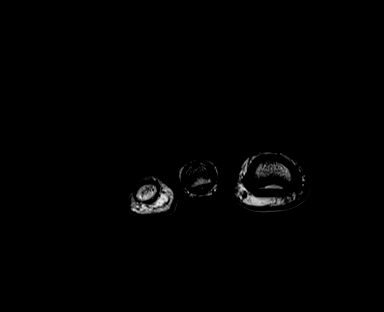
[im 8/40]
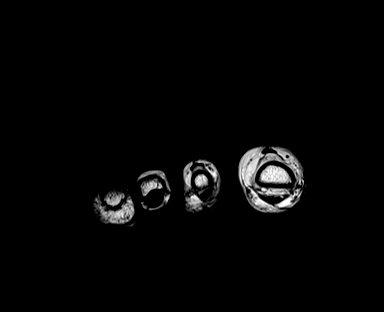
[im 11/40]
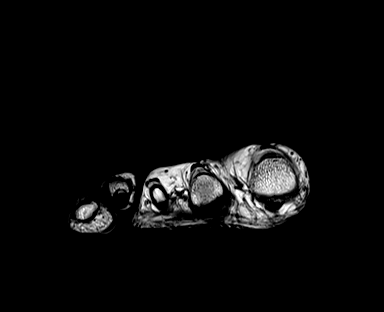
[im 15/40]
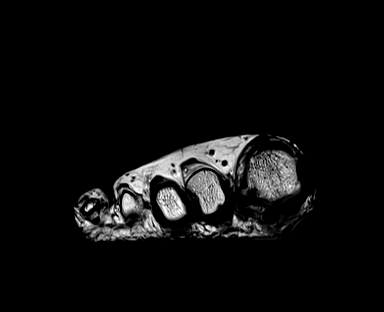
[im 18/40]
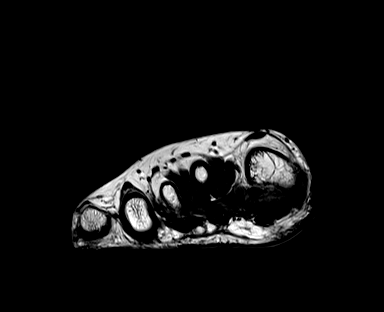
[im 22/40]
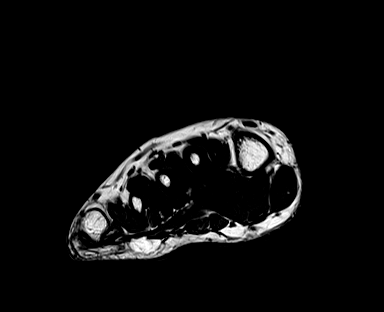
[im 25/40]
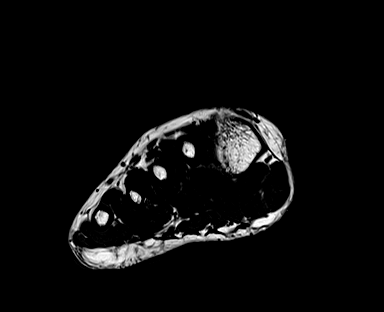
[im 29/40]
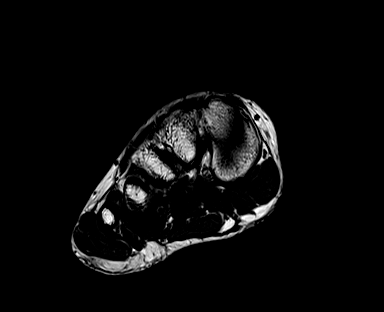
[im 32/40]
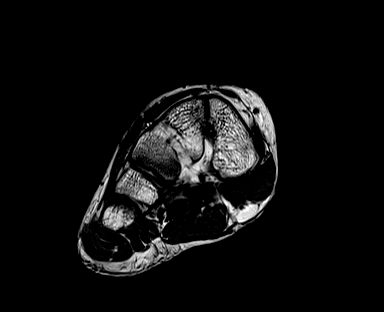
[im 36/40]
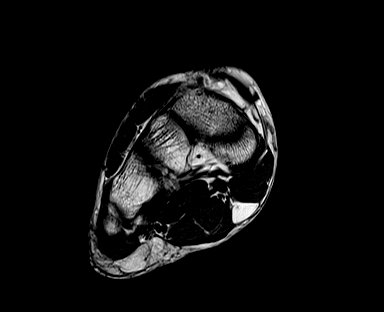
[im 40/40]
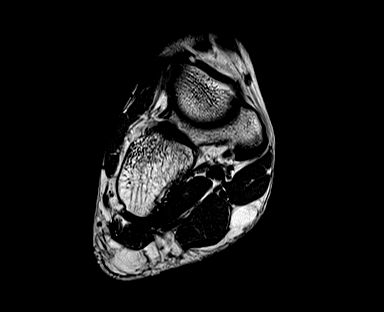

[Series 6: T2 fat-sat · coronal · left · 3.0mm · 0.31mm/px · 12 of 40 slices shown (1 of 2)]
[im 1/40]
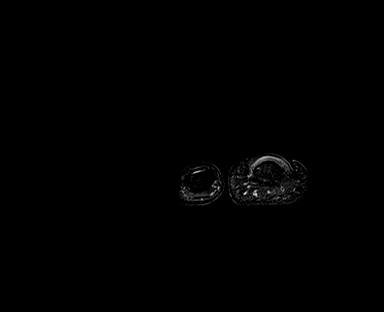
[im 4/40]
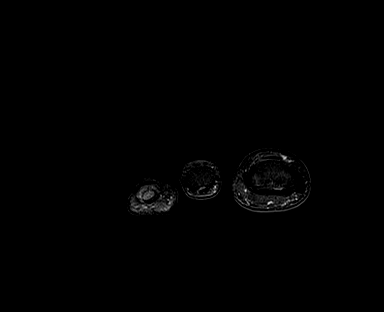
[im 8/40]
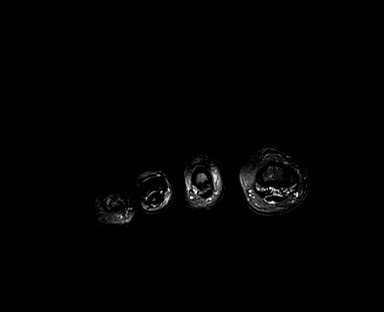
[im 11/40]
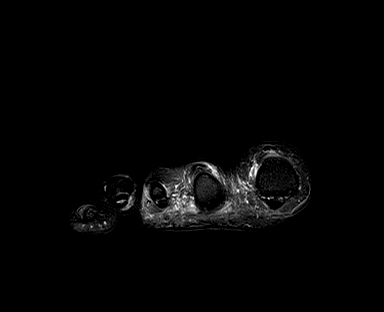
[im 15/40]
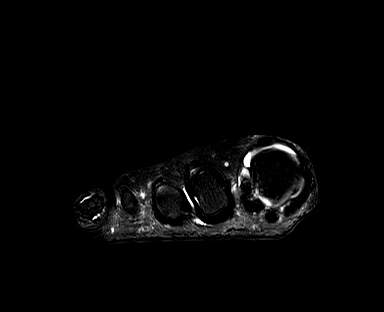
[im 18/40]
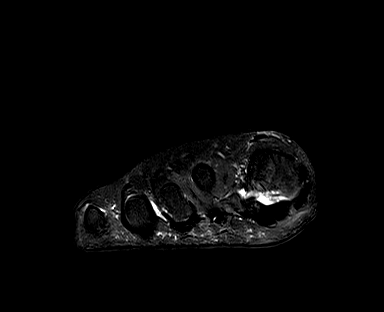
[im 22/40]
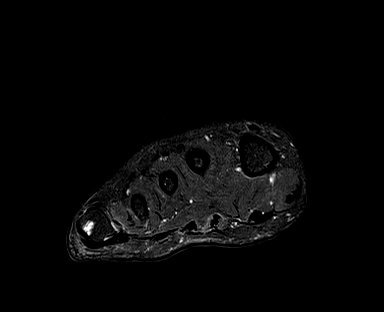
[im 25/40]
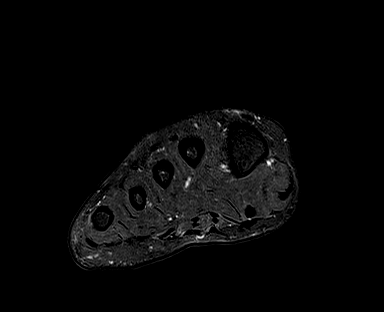
[im 29/40]
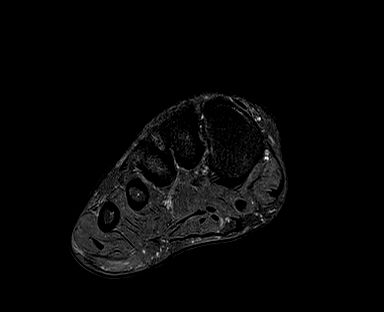
[im 32/40]
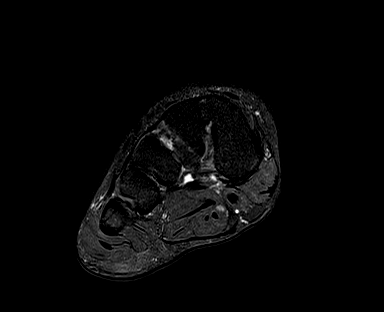
[im 36/40]
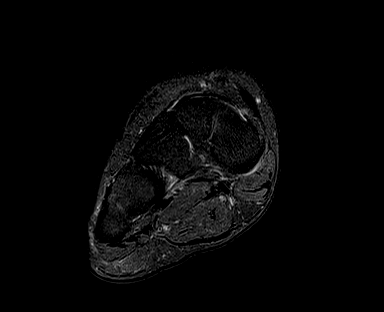
[im 40/40]
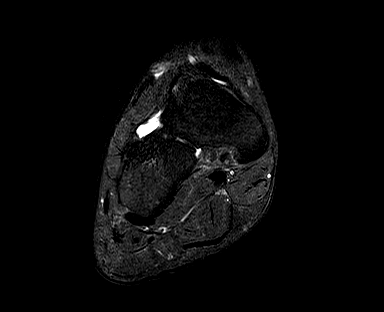

[Series 7: STIR · sagittal · left · 3.0mm · 0.70mm/px · 6 of 22 slices shown]
[im 1/22]
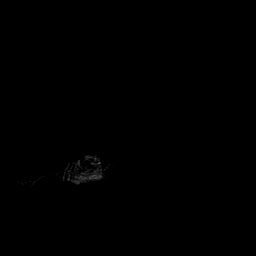
[im 5/22]
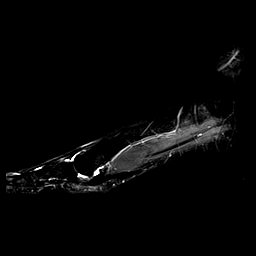
[im 9/22]
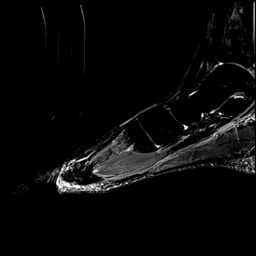
[im 13/22]
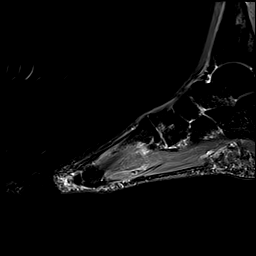
[im 17/22]
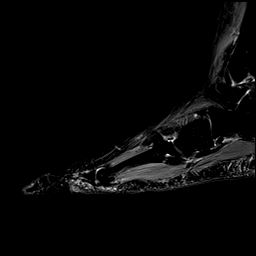
[im 22/22]
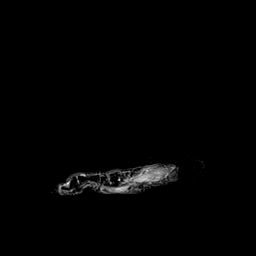

[Series 8: T1 · axial · left · 3.0mm · 0.39mm/px · z∈[-65,+2]mm · 5 of 19 slices shown (2 of 2)]
[im 1/19]
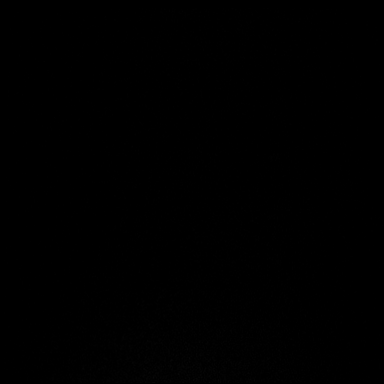
[im 5/19]
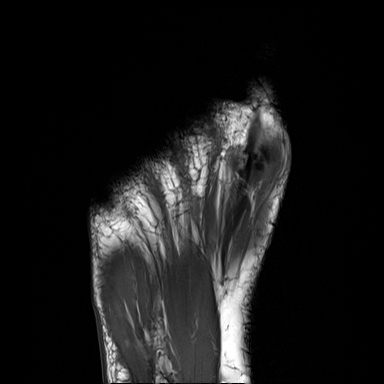
[im 10/19]
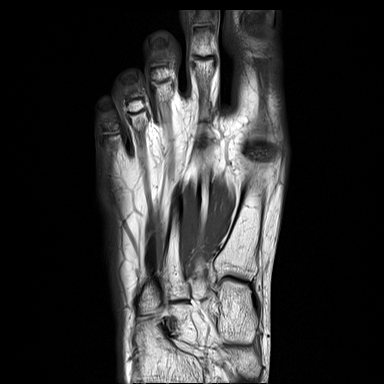
[im 14/19]
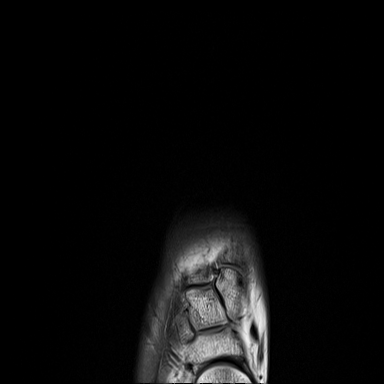
[im 19/19]
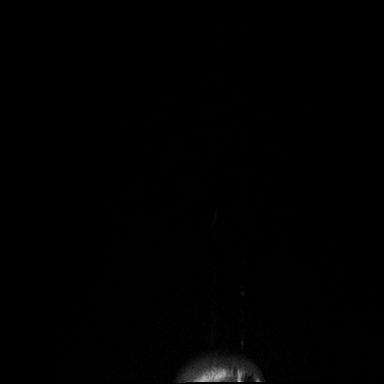

[Series 9: T2 fat-sat · axial · left · 3.0mm · 0.39mm/px · z∈[-65,+2]mm · 5 of 19 slices shown (2 of 2)]
[im 1/19]
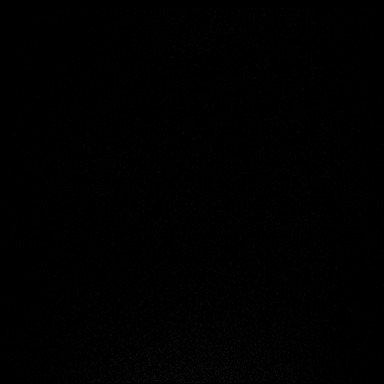
[im 5/19]
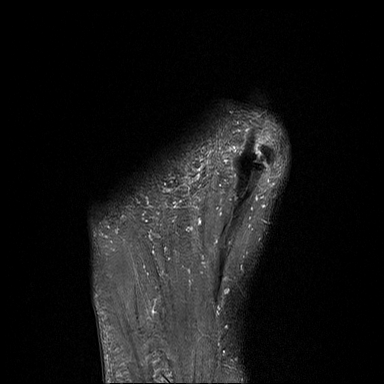
[im 10/19]
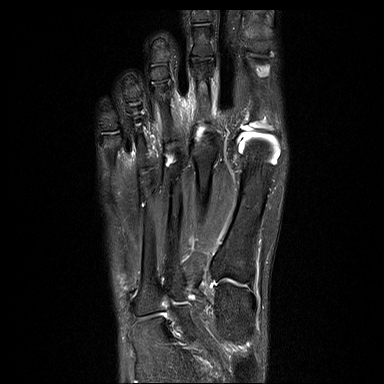
[im 14/19]
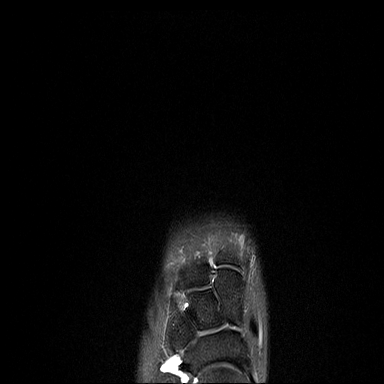
[im 19/19]
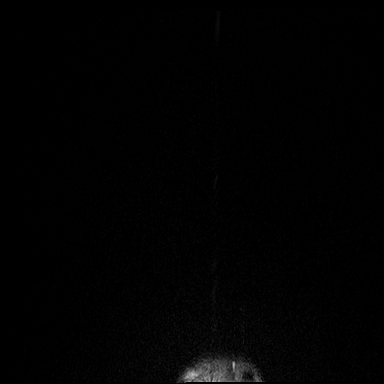

[40 of 40 positions shown; findings below may reference images not displayed]

FINDINGS: Bones/Joint/Cartilage

There is mixed intensely increased T2 and decreased T2 signal in the
medial sesamoid bone. No fracture is identified. The lateral
sesamoid demonstrates normal signal. There is subchondral cyst
formation and subchondral edema in the plantar surface of the head
of the first metatarsal on the lateral side subjacent to the lateral
sesamoid. Bone marrow signal is otherwise normal. The patient has a
small to moderate first MTP joint effusion.

Ligaments

Intact.

Muscles and Tendons

Intact and normal in appearance.

Soft tissues

Normal.
IMPRESSION: Intense edema and sclerosis in the medial sesamoid bone consistent
with sesamoiditis.

Advanced for age first MTP osteoarthritis with subchondral cyst
formation and edema in the plantar surface of the first metatarsal
head subjacent to the lateral sesamoid bone. Small to moderate first
MTP joint effusion noted.

## 2021-01-16 ENCOUNTER — Encounter: Payer: Self-pay | Admitting: Gastroenterology

## 2021-01-16 ENCOUNTER — Ambulatory Visit: Payer: BC Managed Care – PPO | Admitting: Gastroenterology

## 2021-01-16 VITALS — BP 114/78 | HR 72 | Ht 62.0 in | Wt 126.0 lb

## 2021-01-16 DIAGNOSIS — K602 Anal fissure, unspecified: Secondary | ICD-10-CM | POA: Diagnosis not present

## 2021-01-16 DIAGNOSIS — K625 Hemorrhage of anus and rectum: Secondary | ICD-10-CM

## 2021-01-16 DIAGNOSIS — K5909 Other constipation: Secondary | ICD-10-CM

## 2021-01-16 MED ORDER — AMBULATORY NON FORMULARY MEDICATION: 1 refills | Status: DC

## 2021-01-16 NOTE — Patient Instructions (Addendum)
It was a pleasure to meet you today. Based on our discussion, I am providing you with my recommendations below.  RECOMMENDATION(S):    I recommend a high fiber diet. Eat at least 30 grams of fiber in the form of fruit, vegetables, and bran fiber daily.    Please drink at least 1.5-2 liters of water every day.   Add a daily stool bulking agent with psyllium or methylcellulose every day.   Diltiazem 2% compounded with lidocaine 5% ointment applied to the rectum 3 times for 6-8 weeks daily   I have recommended a colonoscopy for further evaluation of your bleeding.    Please call with any questions or concerns prior to your colonoscopy.  PRESCRIPTION MEDICATION(S):   We have sent the following medication(s) to your [facility]:   Diltiazem 2% compounded with lidocaine 5% ointment applied to the rectum 3 times for 6-8 weeks daily  NOTE: If your medication(s) requires a PRIOR AUTHORIZATION, we will receive notification from your pharmacy. Once received, the process to submit for approval may take up to 7-10 business days. You will be contacted about any denials we have received from your insurance company as well as alternatives recommended by your provider.  [facility]:  . The following medication has been prescribed:   Diltiazem 2% compounded with lidocaine 5% ointment applied to the rectum 3 times for 6-8 weeks daily o This medication MUST be compounded by a compounding pharmacy. Your prescription has been sent to:  [facility] Address: [address] [place], [place], [place] [address]  [phone]  . Please DO NOT go directly from our office to pick up this medication! Give the pharmacy 1 day to process the prescription. Extra time is required for them to compound your medication.  OVER THE COUNTER MEDICATION(S):   Please purchase the following medications over the counter and take as directed:  Marland Kitchen Daily Stool bulking agent such as Metamucil, Benefiber  or Citrucel  COLONOSCOPY:   . You have been scheduled for a colonoscopy. Please follow written instructions given to you at your visit today.   PREP:   . Please pick up your prep supplies at the pharmacy within the next 1-3 days.  INHALERS:   . If you use inhalers (even only as needed), please bring them with you on the day of your procedure.  COLONOSCOPY TIPS:  . To reduce nausea and dehydration, stay well hydrated for 3-4 days prior to the exam.  . To prevent skin/hemorrhoid irritation - prior to wiping, put A&Dointment or vaseline on the toilet paper. Marland Kitchen Keep a towel or pad on the bed.  Marland Kitchen BEFORE STARTING YOUR PREP, drink  64oz of clear liquids in the morning. This will help to flush the colon and will ensure you are well hydrated!!!!  NOTE - This is in addition to the fluids required for to complete your prep. . Use of a flavored hard candy, such as grape Rubin Payor, can counteract some of the flavor of the prep and may prevent some nausea.   FOLLOW UP:   After your procedure, you will receive a call from my office staff regarding my recommendation for follow up.   If your symptoms do not improve within 6-8 weeks, please call the office.   BMI:  . If you are age 2 or younger, your body mass index should be between 19-25. Your Body mass index is 23.05 kg/m. If this is out of the aformentioned range listed, please consider follow up  with your Primary Care Provider.   Thank you for trusting me with your gastrointestinal care!    Tressia Danas, MD, MPH

## 2021-01-16 NOTE — Progress Notes (Signed)
[facility]  Pharmacy: [facility]  Medication: Diltiazem 2% compounded with lidocaine 5% ointment applied to the rectum 3 times for 6-8 weeks daily Confirmation received: Yes Document and confirmation held for future reference: Yes

## 2021-01-16 NOTE — Progress Notes (Signed)
Referring Provider: Erasmo Downer, NP Primary Care Physician:  Erasmo Downer, NP  Reason for Consultation: 'I hope you can lance my hemorrhoids"   IMPRESSION:  Rectal bleeding x6 months Posterior fissure External and suspected internal hemorrhoids Chronic constipation No known family history of colon cancer or polyps  Rectal bleeding is likely due to fissure +/- hemorrhoids. Must also consider polyps, mass, ulcers, and colitis.  Given this differential I am recommending a colonoscopy.  PLAN: - CBC - High fiber diet recommended, drink at least 1.5-2 liters of water - Daily stool bulking agent with psyllium or methylcellulose recommended - Diltiazem 2% compounded with lidocaine 5% ointment applied to the rectum 3 times for 6-8 weeks daily - Colonoscopy - Follow-up in 6-8 weeks if symptoms are not improving  Please see the "Patient Instructions" section for addition details about the plan.  HPI: Sarah [name] is a 35 y.o. female self-referred for constipation and rectal bleeding that she attributes to hemorrhoids.  The history is obtained through the patient review of her electronic health record. She is a Administrator, arts at [employer]. She has a history of anxiety and asthma.  History of C section x 2.   Has had problems with intermittent hemorrhoids over the last 15months. Has noted bright red blood on the toilet paper and in the toilet bowl with some bowel movements. Significant bleeding when the hemorrhoids 'pops." No pain on defecation. There is anal bleeding with every bowel movement. No history of rectal foreign body or trauma. No prior history of bleeding or similar rectal pain.   She reports intermittent constipation that is predominantly related to stress and dietary changes. Some improvement in constipation when she stopped taking a gummy vitamin c and zinc.  Currently having a bowel movement every other day, in the past she would have a bowel every day. Some  straining. Complete evacuation. No manual assistance with defecation.  Has used an Equate brand hemorrhoid cream for itching without any change in symptoms. Wishes to avoid medications.   No prior endoscopy or abdominal imaging.  No known family history of colon cancer or polyps. No family history of uterine/endometrial cancer, pancreatic cancer or gastric/stomach cancer.   Past Medical History:  Diagnosis Date  . Anxiety   . Asthma    sports related  . Heart murmur   . Heart murmur    during prgnancy  . Hx of varicella   . Partial hearing loss    R ear  . S/P cesarean section 01/18/2016    Past Surgical History:  Procedure Laterality Date  . CESAREAN SECTION N/A 03/30/2014   Procedure: CESAREAN SECTION;  Surgeon: Lavina Hamman, MD;  Location: [facility];  Service: Obstetrics;  Laterality: N/A;  . CESAREAN SECTION N/A 01/18/2016   Procedure: CESAREAN SECTION;  Surgeon: Sherian Rein, MD;  Location: [facility];  Service: Obstetrics;  Laterality: N/A;  . SESAMOIDECTOMY Right 02/03/2019   Procedure: RIGHT MEDIAL SESAMOIDECTOMY;  Surgeon: Terance Hart, MD;  Location: [facility];  Service: Orthopedics;  Laterality: Right;  . TONSILLECTOMY    . TYMPANOSTOMY TUBE PLACEMENT      Current Outpatient Medications  Medication Sig Dispense Refill  . albuterol (PROVENTIL HFA;[name] 108 (90 BASE) MCG/ACT inhaler Inhale 1-2 puffs into the lungs every 6 (six) hours as needed for wheezing or shortness of breath.     . AMBULATORY NON FORMULARY MEDICATION Diltiazem 2% compounded with lidocaine 5% ointment applied to the rectum 3 times daily for 6-8  weeks 1 each 1  . Cholecalciferol (VITAMIN D3) 50 MCG (2000 UT) TABS Take by mouth.    Marland Kitchen PARAGARD INTRAUTERINE COPPER IU by Intrauterine route.    . vitamin C (ASCORBIC ACID) 500 MG tablet Take 500 mg by mouth daily.    . Zinc 50 MG CAPS Take 1 capsule by mouth daily.    . clonazePAM (KLONOPIN) 0.5 MG tablet Take 0.5 mg by  mouth 2 (two) times daily as needed for anxiety. (Patient not taking: Reported on 01/16/2021)    . meloxicam (MOBIC) 7.5 MG tablet Take 7.5 mg by mouth daily. (Patient not taking: Reported on 01/16/2021)     No current facility-administered medications for this visit.    Allergies as of 01/16/2021 - Review Complete 01/16/2021  Allergen Reaction Noted  . Latex Rash 03/30/2014  . Penicillins Anxiety 03/30/2014    Family History  Problem Relation Age of Onset  . Hypertension Mother   . Hyperlipidemia Mother   . Colon polyps Mother   . Hypertension Father   . Thyroid disease Father   . Colon polyps Father   . Other Father        prediabetes  . Skin cancer Father   . Thyroid disease Paternal Uncle   . Kidney cancer Paternal Uncle   . Heart attack Paternal Uncle   . Skin cancer Sister   . Heart attack Paternal Grandfather 56       Deceased  . Heart attack Paternal Uncle 40  . Thyroid disease Paternal Grandmother   . Mental illness Maternal Aunt        x 2    Social History   Socioeconomic History  . Marital status: Married    Spouse name: Not on file  . Number of children: 2  . Years of education: Not on file  . Highest education level: Not on file  Occupational History  . Occupation: Runner, broadcasting/film/video  Tobacco Use  . Smoking status: Never Smoker  . Smokeless tobacco: Never Used  Vaping Use  . Vaping Use: Never used  Substance and Sexual Activity  . Alcohol use: No  . Drug use: No  . Sexual activity: Yes    Birth control/protection: I.U.D.  Other Topics Concern  . 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  66 month old daughter.    Social Determinants of Health   Financial Resource Strain: Not on file  Food Insecurity: Not on file  Transportation Needs: Not on file  Physical Activity: Not on file  Stress: Not on file  Social Connections: Not on file  Intimate Partner Violence: Not on file    Review of Systems: 12 system ROS is  negative except as noted above.   Physical Exam: General:   Alert,  well-nourished, pleasant and cooperative in NAD Head:  Normocephalic and atraumatic. Eyes:  Sclera clear, no icterus.   Conjunctiva pink. Abdomen:  Soft, nontender, nondistended, normal bowel sounds, no rebound or guarding. No hepatosplenomegaly.   Rectal:  No chemical dermatitis. Small, non bleeding external hemorrhoids. Posterior anal fissure. No obvious fistula. No prolapsing hemorrhoids. No rectal prolapse. Normal anocutaneous reflex. No stool in the rectal vault. No mass or fecal impaction. Normal anal resting tone. Chaperone: Ammie  Msk:  Symmetrical. No boney deformities LAD: No inguinal or umbilical LAD Extremities:  No clubbing or edema. Neurologic:  Alert and  oriented x4;  grossly nonfocal Skin:  Intact without significant lesions or rashes. Psych:  Alert and cooperative. Normal  mood and affect.     Media Pizzini L. Orvan Falconer, MD, MPH 01/18/2021, 4:59 PM

## 2021-01-16 NOTE — Progress Notes (Signed)
Rectal exam

## 2021-01-18 ENCOUNTER — Encounter: Payer: Self-pay | Admitting: Gastroenterology

## 2021-03-26 ENCOUNTER — Other Ambulatory Visit: Payer: Self-pay

## 2021-03-26 ENCOUNTER — Encounter: Payer: Self-pay | Admitting: Gastroenterology

## 2021-03-26 ENCOUNTER — Ambulatory Visit (AMBULATORY_SURGERY_CENTER): Payer: BC Managed Care – PPO | Admitting: Gastroenterology

## 2021-03-26 VITALS — BP 105/55 | HR 66 | Temp 98.6°F | Resp 10 | Ht 62.0 in | Wt 126.0 lb

## 2021-03-26 DIAGNOSIS — K648 Other hemorrhoids: Secondary | ICD-10-CM

## 2021-03-26 DIAGNOSIS — K602 Anal fissure, unspecified: Secondary | ICD-10-CM

## 2021-03-26 DIAGNOSIS — K625 Hemorrhage of anus and rectum: Secondary | ICD-10-CM | POA: Diagnosis not present

## 2021-03-26 DIAGNOSIS — K5909 Other constipation: Secondary | ICD-10-CM

## 2021-03-26 MED ORDER — SODIUM CHLORIDE 0.9 % IV SOLN: 500.0000 mL | Freq: Once | INTRAVENOUS | Status: DC

## 2021-03-26 NOTE — Patient Instructions (Signed)
YOU HAD AN ENDOSCOPIC PROCEDURE TODAY AT THE [facility]:   Refer to the procedure report that was given to you for any specific questions about what was found during the examination.  If the procedure report does not answer your questions, please call your gastroenterologist to clarify.  If you requested that your care partner not be given the details of your procedure findings, then the procedure report has been included in a sealed envelope for you to review at your convenience later.  YOU SHOULD EXPECT: Some feelings of bloating in the abdomen. Passage of more gas than usual.  Walking can help get rid of the air that was put into your GI tract during the procedure and reduce the bloating. If you had a lower endoscopy (such as a colonoscopy or flexible sigmoidoscopy) you may notice spotting of blood in your stool or on the toilet paper. If you underwent a bowel prep for your procedure, you may not have a normal bowel movement for a few days.  Please Note:  You might notice some irritation and congestion in your nose or some drainage.  This is from the oxygen used during your procedure.  There is no need for concern and it should clear up in a day or so.  SYMPTOMS TO REPORT IMMEDIATELY:  Following lower endoscopy (colonoscopy or flexible sigmoidoscopy):  Excessive amounts of blood in the stool  Significant tenderness or worsening of abdominal pains  Swelling of the abdomen that is new, acute  Fever of 100F or higher    For urgent or emergent issues, a gastroenterologist can be reached at any hour by calling [phone]. Do not use MyChart messaging for urgent concerns.    DIET:  We do recommend a small meal at first, but then you may proceed to your regular diet.  Drink plenty of fluids but you should avoid alcoholic beverages for 24 hours.  ACTIVITY:  You should plan to take it easy for the rest of today and you should NOT DRIVE or use heavy machinery until tomorrow (because  of the sedation medicines used during the test).    FOLLOW UP: Our staff will call the number listed on your records 48-72 hours following your procedure to check on you and address any questions or concerns that you may have regarding the information given to you following your procedure. If we do not reach you, we will leave a message.  We will attempt to reach you two times.  During this call, we will ask if you have developed any symptoms of COVID 19. If you develop any symptoms (ie: fever, flu-like symptoms, shortness of breath, cough etc.) before then, please call [phone].  If you test positive for Covid 19 in the 2 weeks post procedure, please call and report this information to [place].    If any biopsies were taken you will be contacted by phone or by letter within the next 1-3 weeks.  Please call us at [phone] if you have not heard about the biopsies in 3 weeks.    SIGNATURES/CONFIDENTIALITY: You and/or your care partner have signed paperwork which will be entered into your electronic medical record.  These signatures attest to the fact that that the information above on your After Visit Summary has been reviewed and is understood.  Full responsibility of the confidentiality of this discharge information lies with you and/or your care-partner.    Resume medications. Recommend using daily metamucil or benefiber.information given on hemorrhoids.

## 2021-03-26 NOTE — Op Note (Signed)
[facility] Patient Name: Sarah [name] Procedure Date: 03/26/2021 7:43 AM MRN: [id number] Endoscopist: Tressia Danas MD, MD Age: 35 Referring MD:  Date of Birth: [birth year] Gender: Female Account #: [id number] Procedure:                Colonoscopy Indications:              Rectal bleeding x6 months                           Posterior fissure on exam in the office                           External and suspected internal hemorrhoids                           Incidental history: Chronic constipation                           No known family history of colon cancer or polyps Medicines:                Monitored Anesthesia Care Procedure:                Pre-Anesthesia Assessment:                           - Prior to the procedure, a History and Physical                            was performed, and patient medications and                            allergies were reviewed. The patient's tolerance of                            previous anesthesia was also reviewed. The risks                            and benefits of the procedure and the sedation                            options and risks were discussed with the patient.                            All questions were answered, and informed consent                            was obtained. Prior Anticoagulants: The patient has                            taken no previous anticoagulant or antiplatelet                            agents. ASA Grade Assessment: II - A patient with  mild systemic disease. After reviewing the risks                            and benefits, the patient was deemed in                            satisfactory condition to undergo the procedure.                           After obtaining informed consent, the colonoscope                            was passed under direct vision. Throughout the                            procedure, the patient's blood pressure, pulse, and                             oxygen saturations were monitored continuously. The                            Olympus PFC-[id number] (#[id number]) Colonoscope was                            introduced through the anus and advanced to the 3                            cm into the ileum. A second forward view of the                            right colon was performed. The colonoscopy was                            performed without difficulty. The patient tolerated                            the procedure well. The quality of the bowel                            preparation was good. Scope In: 8:05:45 AM Scope Out: 8:18:55 AM Scope Withdrawal Time: 0 hours 9 minutes 38 seconds  Total Procedure Duration: 0 hours 13 minutes 10 seconds  Findings:                 Evidence for a healed posterior anal fissure was                            found on perianal exam.                           Non-bleeding external and internal hemorrhoids were                            found.  The entire examined colon otherwise appeared normal.                           The terminal ileum appeared normal. Complications:            No immediate complications. Estimated Blood Loss:     Estimated blood loss: none. Impression:               - Healed anal fissure found on perianal exam.                           - Non-bleeding external and internal hemorrhoids. Recommendation:           - Patient has a contact number available for                            emergencies. The signs and symptoms of potential                            delayed complications were discussed with the                            patient. Return to normal activities tomorrow.                            Written discharge instructions were provided to the                            patient.                           - Resume previous diet. Follow a high fiber diet.                            Drink at least 64 ounces of water daily.                            - Use a daily stool bulking agent such as Metamucil                            or Benefiber.                           - Continue present medications.                           - Start colon cancer screening at age 68, earlier                            with new symptoms.                           - Emerging evidence supports eating a diet of                            fruits, vegetables, grains, calcium, and yogurt  while reducing red meat and alcohol may reduce the                            risk of colon cancer. Tressia DanasKimberly Jehiel Koepp MD, MD 03/26/2021 8:28:43 AM This report has been signed electronically.

## 2021-03-26 NOTE — Progress Notes (Signed)
pt tolerated well. VSS. awake and to recovery. Report given to RN.  

## 2021-03-26 NOTE — Progress Notes (Signed)
VS- Timon Geissinger RN ° °Pt's states no medical or surgical changes since previsit or office visit. ° °

## 2021-03-28 ENCOUNTER — Telehealth: Payer: Self-pay

## 2021-03-28 ENCOUNTER — Telehealth: Payer: Self-pay | Admitting: *Deleted

## 2021-03-28 NOTE — Telephone Encounter (Signed)
  Follow up Call-  Call back number 03/26/2021  Post procedure Call Back phone  # [phone]  Permission to leave phone message Yes  Some recent data might be hidden     Patient questions:  Do you have a fever, pain , or abdominal swelling? No. Pain Score  0 *  Have you tolerated food without any problems? Yes.    Have you been able to return to your normal activities? Yes.    Do you have any questions about your discharge instructions: Diet   No. Medications  No. Follow up visit  No.  Do you have questions or concerns about your Care? No.  Actions: * If pain score is 4 or above: No action needed, pain <4.  Have you developed a fever since your procedure? no  2.   Have you had an respiratory symptoms (SOB or cough) since your procedure? no  3.   Have you tested positive for COVID 19 since your procedure no  4.   Have you had any family members/close contacts diagnosed with the COVID 19 since your procedure?  no   If yes to any of these questions please route to Laverna Peace, RN and Karlton Lemon, RN

## 2021-03-28 NOTE — Telephone Encounter (Signed)
Message left

## 2021-07-26 DIAGNOSIS — Z23 Encounter for immunization: Secondary | ICD-10-CM | POA: Diagnosis not present

## 2022-10-02 ENCOUNTER — Emergency Department (HOSPITAL_COMMUNITY)
Admission: EM | Admit: 2022-10-02 | Discharge: 2022-10-02 | Disposition: A | Discharge status: Home / Self Care | Payer: BC Managed Care – PPO | Attending: Student | Admitting: Student

## 2022-10-02 ENCOUNTER — Emergency Department (HOSPITAL_COMMUNITY): Payer: BC Managed Care – PPO

## 2022-10-02 ENCOUNTER — Other Ambulatory Visit: Payer: Self-pay

## 2022-10-02 ENCOUNTER — Encounter (HOSPITAL_COMMUNITY): Payer: Self-pay | Admitting: *Deleted

## 2022-10-02 DIAGNOSIS — Z91040 Latex allergy status: Secondary | ICD-10-CM | POA: Insufficient documentation

## 2022-10-02 DIAGNOSIS — R1011 Right upper quadrant pain: Secondary | ICD-10-CM | POA: Insufficient documentation

## 2022-10-02 DIAGNOSIS — R0789 Other chest pain: Secondary | ICD-10-CM

## 2022-10-02 HISTORY — DX: Anorexia: R63.0

## 2022-10-02 LAB — COMPREHENSIVE METABOLIC PANEL
ALT: 14 U/L (ref 0–44)
AST: 19 U/L (ref 15–41)
Albumin: 5 g/dL (ref 3.5–5.0)
Alkaline Phosphatase: 44 U/L (ref 38–126)
Anion gap: 10 (ref 5–15)
BUN: 5 mg/dL — ABNORMAL LOW (ref 6–20)
CO2: 27 mmol/L (ref 22–32)
Calcium: 9.1 mg/dL (ref 8.9–10.3)
Chloride: 99 mmol/L (ref 98–111)
Creatinine, Ser: 0.58 mg/dL (ref 0.44–1.00)
GFR, Estimated: >60 mL/min (ref >60)
Glucose, Bld: 114 mg/dL — ABNORMAL HIGH (ref 70–99)
Potassium: 3.5 mmol/L (ref 3.5–5.1)
Sodium: 136 mmol/L (ref 135–145)
Total Bilirubin: 1.5 mg/dL — ABNORMAL HIGH (ref 0.3–1.2)
Total Protein: 8.2 g/dL — ABNORMAL HIGH (ref 6.5–8.1)

## 2022-10-02 LAB — CBC
HCT: 42.1 % (ref 36.0–46.0)
Hemoglobin: 13.8 g/dL (ref 12.0–15.0)
MCH: 30.3 pg (ref 26.0–34.0)
MCHC: 32.8 g/dL (ref 30.0–36.0)
MCV: 92.3 fL (ref 80.0–100.0)
Platelets: 282 10*3/uL (ref 150–400)
RBC: 4.56 MIL/uL (ref 3.87–5.11)
RDW: 11.8 % (ref 11.5–15.5)
WBC: 5.4 10*3/uL (ref 4.0–10.5)
nRBC: 0 % (ref 0.0–0.2)

## 2022-10-02 LAB — PHOSPHORUS: Phosphorus: 3.1 mg/dL (ref 2.5–4.6)

## 2022-10-02 LAB — MAGNESIUM: Magnesium: 2.2 mg/dL (ref 1.7–2.4)

## 2022-10-02 LAB — TROPONIN I (HIGH SENSITIVITY): Troponin I (High Sensitivity): <2 ng/L (ref <18)

## 2022-10-02 LAB — PREGNANCY, URINE: Preg Test, Ur: NEGATIVE

## 2022-10-02 MED ORDER — LIDOCAINE VISCOUS HCL 2 % MT SOLN
15.0000 mL | Freq: Once | OROMUCOSAL | Status: AC
  Administered 2022-10-02: 15 mL via ORAL
  Filled 2022-10-02: qty 15

## 2022-10-02 MED ORDER — ACETAMINOPHEN 325 MG PO TABS
650.0000 mg | ORAL_TABLET | Freq: Once | ORAL | Status: AC
  Administered 2022-10-02: 650 mg via ORAL
  Filled 2022-10-02: qty 2

## 2022-10-02 MED ORDER — ALUM & MAG HYDROXIDE-SIMETH 200-200-20 MG/5ML PO SUSP
30.0000 mL | Freq: Once | ORAL | Status: AC
  Administered 2022-10-02: 30 mL via ORAL
  Filled 2022-10-02: qty 30

## 2022-10-02 NOTE — Discharge Instructions (Signed)
Please use Tylenol or ibuprofen for pain.  You may use 600 mg ibuprofen every 6 hours or 1000 mg of Tylenol every 6 hours.  You may choose to alternate between the 2.  This would be most effective.  Not to exceed 4 g of Tylenol within 24 hours.  Not to exceed 3200 mg ibuprofen 24 hours.  Please return if your chest pain worsens, fails to improve, you become significantly short of breath, begin having blood while coughing or vomiting.

## 2022-10-02 NOTE — ED Notes (Signed)
Pt is having [place], will medicate once done.  Pt advised that urine sample is needed

## 2022-10-02 NOTE — ED Provider Notes (Signed)
[facility] EMERGENCY DEPARTMENT Provider Note   CSN: [id number] Arrival date & time: 10/02/22  1318     History  Chief Complaint  Patient presents with   Chest Pain    Sarah [name] is a 37 y.o. female with recent past medical history significant for anxiety, she endorses some palpitations in pregnancy but none at baseline, reports cardiology GU workup remotely with no abnormalities, endorses recent diagnosis of anorexia.  Patient denies any purging, bulimia, reports that she has not been eating as much as she may be should but still has had 1-2 meals a day for the last week.  She reports a new onset pressure-like chest pain running from the base of her neck down to the bottom of her sternum since around Monday evening.  She reports it is more severe at night, but not worsened with lying in recumbent position, sitting up, nonexertional, not associated with diaphoresis, radiation to arms, neck, nausea, vomiting.  She denies any previous history of hypertension, diabetes, tobacco abuse.  She denies strong family history of cardiac condition.   Chest Pain      Home Medications Prior to Admission medications   Medication Sig Start Date End Date Taking? Authorizing Provider  albuterol (PROVENTIL HFA;[name] 108 (90 BASE) MCG/ACT inhaler Inhale 1-2 puffs into the lungs every 6 (six) hours as needed for wheezing or shortness of breath.     [provider]  AMBULATORY NON FORMULARY MEDICATION Diltiazem 2% compounded with lidocaine 5% ointment applied to the rectum 3 times daily for 6-8 weeks 01/16/21   Tressia Danas, MD  Cholecalciferol (VITAMIN D3) 50 MCG (2000 UT) TABS Take by mouth.    Alver Fisher, RN  clonazePAM (KLONOPIN) 0.5 MG tablet Take 0.5 mg by mouth 2 (two) times daily as needed for anxiety. Patient not taking: No sig reported    Alver Fisher, RN  meloxicam (MOBIC) 7.5 MG tablet Take 7.5 mg by mouth daily. Patient not taking: No sig reported    [provider]  PARAGARD INTRAUTERINE COPPER IU by Intrauterine route.    Alver Fisher, RN  vitamin C (ASCORBIC ACID) 500 MG tablet Take 500 mg by mouth daily.    [provider]  Zinc 50 MG CAPS Take 1 capsule by mouth daily.    [provider]      Allergies    Latex and Penicillins    Review of Systems   Review of Systems  Cardiovascular:  Positive for chest pain.  All other systems reviewed and are negative.   Physical Exam Updated Vital Signs BP (!) 135/94 (BP Location: Right Arm)   Pulse 92   Temp 98.1 F (36.7 C) (Oral)   Resp 18   Ht 5\' 2"  (1.575 m)   Wt 44 kg   LMP 09/19/2022   SpO2 99%   BMI 17.74 kg/m  Physical Exam Vitals and nursing note reviewed.  Constitutional:      General: She is not in acute distress.    Appearance: Normal appearance.     Comments: Thin, cachectic  HENT:     Head: Normocephalic and atraumatic.  Eyes:     General:        Right eye: No discharge.        Left eye: No discharge.  Cardiovascular:     Rate and Rhythm: Normal rate and regular rhythm.     Heart sounds: No murmur heard.    No friction rub. No gallop.  Pulmonary:  Effort: Pulmonary effort is normal.     Breath sounds: Normal breath sounds.  Abdominal:     General: Bowel sounds are normal.     Palpations: Abdomen is soft.     Comments: Minimal ttp in RUQ, no rebound  Skin:    General: Skin is warm and dry.     Capillary Refill: Capillary refill takes less than 2 seconds.  Neurological:     Mental Status: She is alert and oriented to person, place, and time.  Psychiatric:        Mood and Affect: Mood normal.        Behavior: Behavior normal.     ED Results / Procedures / Treatments   Labs (all labs ordered are listed, but only abnormal results are displayed) Labs Reviewed  COMPREHENSIVE METABOLIC PANEL - Abnormal; Notable for the following components:      Result Value   Glucose, Bld 114 (*)    BUN 5 (*)    Total Protein 8.2 (*)     Total Bilirubin 1.5 (*)    All other components within normal limits  CBC  PREGNANCY, URINE  MAGNESIUM  PHOSPHORUS  TROPONIN I (HIGH SENSITIVITY)    EKG None  Radiology US Abdomen Limited RUQ (LIVER/GB)  Result Date: 10/02/2022 CLINICAL DATA:  Four days of right upper quadrant abdominal pain. EXAM: ULTRASOUND ABDOMEN LIMITED RIGHT UPPER QUADRANT COMPARISON:  None Available. FINDINGS: Gallbladder: No gallstones or wall thickening visualized. No sonographic Murphy sign noted by sonographer. Common bile duct: Not visualized Liver: No focal lesion identified. Within normal limits in parenchymal echogenicity. Portal vein is patent on color Doppler imaging with normal direction of blood flow towards the liver. Other: None. IMPRESSION: No cholelithiasis or sonographic evidence of acute cholecystitis. Electronically Signed   By: Dahlia Bailiff M.D.   On: 10/02/2022 15:21   DG Chest 2 View  Result Date: 10/02/2022 CLINICAL DATA:  Chest pain and shortness of breath EXAM: CHEST - 2 VIEW COMPARISON:  Report 2001 chest x-ray FINDINGS: Hyperinflation. No consolidation, pneumothorax or effusion. No edema. Normal cardiopericardial silhouette. Minimal curvature of the spine. IMPRESSION: Hyperinflation.  No acute cardiopulmonary disease. Electronically Signed   By: Jill Side M.D.   On: 10/02/2022 14:25    Procedures Procedures    Medications Ordered in ED Medications  acetaminophen (TYLENOL) tablet 650 mg (650 mg Oral Given 10/02/22 1520)  alum & mag hydroxide-simeth (MAALOX/MYLANTA) 200-200-20 MG/5ML suspension 30 mL (30 mLs Oral Given 10/02/22 1534)    And  lidocaine (XYLOCAINE) 2 % viscous mouth solution 15 mL (15 mLs Oral Given 10/02/22 1535)    ED Course/ Medical Decision Making/ A&P                           Medical Decision Making Amount and/or Complexity of Data Reviewed Labs: ordered. Radiology: ordered.  Risk OTC drugs.   This patient is a 37 y.o. female  who presents to the ED  for concern of chest pain.   Differential diagnoses prior to evaluation: The emergent differential diagnosis includes, but is not limited to,  ACS, AAS, PE, Mallory-Weiss, Boerhaave's, Pneumonia, acute bronchitis, asthma or COPD exacerbation, anxiety, MSK pain or traumatic injury to the chest, acid reflux versus other . This is not an exhaustive differential.   Past Medical History / Co-morbidities: History of anorexia, no previous history of hypertension, diabetes, tobacco use, family history of ACS, hyperlipidemia, does endorse history of anxiety, reports heart murmurs during  pregnancy, had cardiology workup with no significant findings, no noted aortic stenosis, mitral regurg, or other  Additional history: Chart reviewed. Pertinent results include: Reviewed outpatient gastroenterology, PCP notes, no recent previous emergency department visits on file  Physical Exam: Physical exam performed. The pertinent findings include: Patient is fairly thin, cachectic, hollow eye sockets, somewhat dry appearing overall.  No significant tenderness to palpation of chest, she does have some tenderness to palpation of the abdomen without rebound, rigidity, guarding.  No distention throughout.  Lab Tests/Imaging studies: I personally interpreted labs/imaging and the pertinent results include: CBC unremarkable, troponin negative x 1 in context of chest pain that is ongoing, constant since Monday night.  Phosphorus is normal at 3.1, potassium notably normal, on the slightly low end at 3.5, encouraged reasonable dietary intake, she has mildly elevated T. bili at 1.5 without other elevated transaminases of unclear significance, magnesium normal, pregnancy test negative. Plain film chest xray without significant changes, some hyperinflation noted. I agree with the radiologist interpretation.  Right upper quadrant ultrasound shows no acute gallbladder or bile duct pathology.  Cardiac monitoring: EKG obtained and  interpreted by my attending physician which shows: Normal sinus rhythm, some inferior lead depression compared to previous EKG noted, overall increased amplitude overall, no evidence of clear acute ischemia.   Medications: I ordered medication including tylenol, GI cocktail.  I have reviewed the patients home medicines and have made adjustments as needed.  Patient with very mild improvement in pain, still having some mild nonspecific arm pain, peers to be in no acute distress, reports pain is manageable with oral medications, does not request anything additional to help with her pain.   Disposition: After consideration of the diagnostic results and the patients response to treatment, I feel that patient with nonspecific chest pain at this time, consider broad differential but think that anxiety, MSK related chest pain, GI, acid reflux.  Patient is negative by [facility] criteria, she has not exertional chest pain with atypical story, I think that unstable angina is very unlikely, however I think that patient would benefit from cardiology evaluation if symptoms continue.  She appears stable for discharge at this time, consider admission for chest pain workup, but with several days of chest pain, atypical story, and unremarkable workup today I think that she is stable for discharge at this time.   emergency department workup does not suggest an emergent condition requiring admission or immediate intervention beyond what has been performed at this time. The plan is: as above. The patient is safe for discharge and has been instructed to return immediately for worsening symptoms, change in symptoms or any other concerns.  Final Clinical Impression(s) / ED Diagnoses Final diagnoses:  Atypical chest pain    Rx / DC Orders ED Discharge Orders          Ordered    Ambulatory referral to Cardiology       Comments: If you have not heard from the Cardiology office within the next 72 hours please call [phone].    10/02/22 1600              Dorien Chihuahua 10/02/22 1602    Teressa Lower, MD 10/02/22 2147

## 2022-10-02 NOTE — ED Triage Notes (Signed)
Pt with CP and SOB since Monday night.  Recent dx's with anorexia.

## 2022-10-24 ENCOUNTER — Ambulatory Visit: Payer: BC Managed Care – PPO | Attending: Internal Medicine | Admitting: Internal Medicine

## 2022-10-24 ENCOUNTER — Encounter: Payer: Self-pay | Admitting: Internal Medicine

## 2022-10-24 VITALS — BP 108/74 | HR 88 | Ht 62.0 in | Wt 99.8 lb

## 2022-10-24 DIAGNOSIS — R079 Chest pain, unspecified: Secondary | ICD-10-CM

## 2022-10-24 NOTE — Progress Notes (Unsigned)
Cardiology Office Note   Date:  10/24/2022   ID:  Sarah [name], DOB Feb 11, 1986, MRN [id number]  PCP:  Renee Rival, NP  Cardiologist:   Dorris Carnes, MD   Pt persents for evaluation of CP     History of Present Illness: Sarah [name] is a 37 y.o. female with a history of palpitations in the past   She was seen by Vita Barley.  Echo done  Normal  The pt  had an episode of CP a few weeks ago   Occurred when she was diagnosed with anorexia The pt said it started on a Monday afternoon   A pressure, like couldn't get breath   Then stabbing/sharp   Lasted for a few days   Went to ER on Thursday   Blood work showed neg troponin     After that the spell eased  The pt hs been seen by a Retail banker and a dietician in the interval  She is on a structured diet and says she is taking some enzyme supplement  She has not had a recurrence    The pt says her breathing is OK  She is active   No palpitations   No dizziness        Current Meds  Medication Sig   albuterol (PROVENTIL HFA;[name] 108 (90 BASE) MCG/ACT inhaler Inhale 1-2 puffs into the lungs every 6 (six) hours as needed for wheezing or shortness of breath.    AMBULATORY NON FORMULARY MEDICATION Diltiazem 2% compounded with lidocaine 5% ointment applied to the rectum 3 times daily for 6-8 weeks   Cholecalciferol (VITAMIN D3) 50 MCG (2000 UT) TABS Take by mouth.   clonazePAM (KLONOPIN) 0.5 MG tablet Take 0.5 mg by mouth 2 (two) times daily as needed for anxiety.   fluticasone (FLONASE) 50 MCG/ACT nasal spray Place 2 sprays into both nostrils as needed for allergies or rhinitis.   meloxicam (MOBIC) 7.5 MG tablet Take 7.5 mg by mouth daily.   PARAGARD INTRAUTERINE COPPER IU by Intrauterine route.   vitamin C (ASCORBIC ACID) 500 MG tablet Take 500 mg by mouth daily.   Zinc 50 MG CAPS Take 1 capsule by mouth daily.     Allergies:   Latex and Penicillins   Past Medical History:  Diagnosis Date   Anorexia    Anxiety    Asthma     sports related   Chest pain    Heart murmur    Hx of varicella    Partial hearing loss    R ear   S/P cesarean section 01/18/2016    Past Surgical History:  Procedure Laterality Date   CESAREAN SECTION N/A 03/30/2014   Procedure: CESAREAN SECTION;  Surgeon: Cheri Fowler, MD;  Location: [facility];  Service: Obstetrics;  Laterality: N/A;   CESAREAN SECTION N/A 01/18/2016   Procedure: CESAREAN SECTION;  Surgeon: Janyth Contes, MD;  Location: [facility];  Service: Obstetrics;  Laterality: N/A;   SESAMOIDECTOMY Right 02/03/2019   Procedure: RIGHT MEDIAL SESAMOIDECTOMY;  Surgeon: Erle Crocker, MD;  Location: [facility];  Service: Orthopedics;  Laterality: Right;   TONSILLECTOMY     TYMPANOSTOMY TUBE PLACEMENT       Social History:  The patient  reports that she has never smoked. She has never used smokeless tobacco. She reports that she does not drink alcohol and does not use drugs.   Family History:  The patient's family history includes Colon polyps in her father and mother; Heart attack  in her paternal uncle; Heart attack (age of onset: 34) in her paternal uncle; Heart attack (age of onset: 84) in her paternal grandfather; Hyperlipidemia in her mother; Hypertension in her father and mother; Kidney cancer in her paternal uncle; Mental illness in her maternal aunt; Other in her father; Skin cancer in her father and sister; Thyroid disease in her father, paternal grandmother, and paternal uncle.    ROS:  Please see the history of present illness. All other systems are reviewed and  Negative to the above problem except as noted.    PHYSICAL EXAM: VS:  BP 108/74   Pulse 88   Ht 5\' 2"  (1.575 m)   Wt 99 lb 12.8 oz (45.3 kg)   LMP 09/19/2022   SpO2 98%   BMI 18.25 kg/m   GEN: Well nourished, well developed, in no acute distress  HEENT: normal  Neck: no JVD, carotid bruits,  Cardiac: RRR; no murmur  No LE edema  Respiratory:  clear to auscultation  bilaterally, GI: soft, nontender, nondistended, + BS  No hepatomegaly  MS: no deformity Moving all extremities   Skin: warm and dry, no rash Neuro:  Strength and sensation are intact Psych: euthymic mood, full affect   EKG:  EKG is not ordered today.  On 10/02/22  NSR  Nonspecific ST changes (sagging of ST segments)  Present on previous EKG from  Aug 02, 2015 but not Aug 01, 2015   Lipid Panel No results found for: "CHOL", "TRIG", "HDL", "CHOLHDL", "VLDL", "LDLCALC", "LDLDIRECT"    Wt Readings from Last 3 Encounters:  10/24/22 99 lb 12.8 oz (45.3 kg)  10/02/22 97 lb (44 kg)  03/26/21 126 lb (57.2 kg)      ASSESSMENT AND PLAN:  1  Chest pain  Atypical for angina  Troponins negative after prolonged pain     Pain gone now   Sounds more GI in origin  She reports labs way off  WIll review     Her EKG does have some dynamic changes over the years  Nonspecific I would follow   Normal echo   NO further testing planned   2 HCM  Lipids are  good   LDL 82  HDL 80  rig 63      Will be available as needed  Follow up PRN   Current medicines are reviewed at length with the patient today.  The patient does not have concerns regarding medicines.  Signed, Dorris Carnes, MD  10/24/2022 3:29 [facility] [address], [place], [place]  [address] Phone: [phone]; Fax: [phone]

## 2022-10-24 NOTE — Patient Instructions (Signed)
Medication Instructions:  Your physician recommends that you continue on your current medications as directed. Please refer to the Current Medication list given to you today.  *If you need a refill on your cardiac medications before your next appointment, please call your pharmacy*  Lab Work: If you have labs (blood work) drawn today and your tests are completely normal, you will receive your results only by: [place] (if you have MyChart) OR A paper copy in the mail If you have any lab test that is abnormal or we need to change your treatment, we will call you to review the results.  Testing/Procedures: None ordered today.  Follow-Up: At [facility], you and your health needs are our priority.  As part of our continuing mission to provide you with exceptional heart care, we have created designated Provider Care Teams.  These Care Teams include your primary Cardiologist (physician) and Advanced Practice Providers (APPs -  Physician Assistants and Nurse Practitioners) who all work together to provide you with the care you need, when you need it.  We recommend signing up for the patient portal called "MyChart".  Sign up information is provided on this After Visit Summary.  MyChart is used to connect with patients for Virtual Visits (Telemedicine).  Patients are able to view lab/test results, encounter notes, upcoming appointments, etc.  Non-urgent messages can be sent to your provider as well.   To learn more about what you can do with MyChart, go to [email or web address].    Your next appointment:   As needed  Provider:   Dorris Carnes, MD

## 2023-02-24 ENCOUNTER — Other Ambulatory Visit: Payer: Self-pay | Admitting: Student

## 2023-02-24 DIAGNOSIS — G4484 Primary exertional headache: Secondary | ICD-10-CM

## 2023-02-26 ENCOUNTER — Encounter: Payer: Self-pay | Admitting: Neurology

## 2023-03-02 ENCOUNTER — Ambulatory Visit
Admission: RE | Admit: 2023-03-02 | Discharge: 2023-03-02 | Disposition: A | Discharge status: Home / Self Care | Payer: BC Managed Care – PPO | Source: Ambulatory Visit | Attending: Student | Admitting: Student

## 2023-03-02 DIAGNOSIS — G4484 Primary exertional headache: Secondary | ICD-10-CM

## 2023-03-08 ENCOUNTER — Other Ambulatory Visit: Payer: BC Managed Care – PPO

## 2023-06-23 ENCOUNTER — Ambulatory Visit (INDEPENDENT_AMBULATORY_CARE_PROVIDER_SITE_OTHER): Payer: BC Managed Care – PPO | Admitting: Otolaryngology

## 2023-06-23 ENCOUNTER — Encounter (INDEPENDENT_AMBULATORY_CARE_PROVIDER_SITE_OTHER): Payer: Self-pay | Admitting: Otolaryngology

## 2023-06-23 VITALS — Ht 74.0 in | Wt 115.0 lb

## 2023-06-23 DIAGNOSIS — H9011 Conductive hearing loss, unilateral, right ear, with unrestricted hearing on the contralateral side: Secondary | ICD-10-CM | POA: Diagnosis not present

## 2023-06-23 DIAGNOSIS — H6521 Chronic serous otitis media, right ear: Secondary | ICD-10-CM | POA: Diagnosis not present

## 2023-06-23 DIAGNOSIS — H6981 Other specified disorders of Eustachian tube, right ear: Secondary | ICD-10-CM | POA: Insufficient documentation

## 2023-06-23 NOTE — Progress Notes (Signed)
Procedure: Right myringotomy and tube placement.    Indication: Eustachian tube dysfunction and middle ear effusion, not responding to medical treatment.    Descriptions: The patient was placed supine on the operating table.  Under the operating microscope, the right ear canal was cleaned of all cerumen.  1% lidocaine was injected into all four quadrants of the ear canal. After adequate local anesthesia was achieved, a standard myringotomy incision was made at the anterior inferior quadrant of the tympanic membrane.  A copious amount of serous fluid was suctioned from behind the tympanic membrane.  A collar button tube was placed without difficulty.  The patient tolerated the procedure well.    Follow-up care: Dry ear precaution. The patient will return for reevaluation in approximately one month.

## 2023-07-22 ENCOUNTER — Encounter (INDEPENDENT_AMBULATORY_CARE_PROVIDER_SITE_OTHER): Payer: Self-pay

## 2023-07-22 ENCOUNTER — Ambulatory Visit (INDEPENDENT_AMBULATORY_CARE_PROVIDER_SITE_OTHER): Payer: BC Managed Care – PPO | Admitting: Otolaryngology

## 2023-07-22 VITALS — Ht 62.0 in | Wt 117.0 lb

## 2023-07-22 DIAGNOSIS — H9011 Conductive hearing loss, unilateral, right ear, with unrestricted hearing on the contralateral side: Secondary | ICD-10-CM

## 2023-07-22 DIAGNOSIS — H6981 Other specified disorders of Eustachian tube, right ear: Secondary | ICD-10-CM | POA: Diagnosis not present

## 2023-07-22 DIAGNOSIS — H7201 Central perforation of tympanic membrane, right ear: Secondary | ICD-10-CM

## 2023-07-25 DIAGNOSIS — H7201 Central perforation of tympanic membrane, right ear: Secondary | ICD-10-CM | POA: Insufficient documentation

## 2023-07-25 NOTE — Progress Notes (Signed)
Patient ID: Sarah [name], female   DOB: 1986-06-05, 37 y.o.   MRN: [id number]  Follow-up: Right ear eustachian tube dysfunction, right ear conductive hearing loss  HPI: The patient is a 37 year old female who returns today for her follow-up evaluation.  The patient has a history of right ear eustachian tube dysfunction and right ear conductive hearing loss.  She underwent revision right myringotomy and tube placement earlier this month.  She returns today reporting significant improvement in her right ear hearing.  She denies any otalgia, otorrhea, or vertigo.  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  Exam: General: Communicates without difficulty, well nourished, no acute distress. Head: Normocephalic, no evidence injury, no tenderness, facial buttresses intact without stepoff. Eyes: PERRL, EOMI. No scleral icterus, conjunctivae clear. Neuro: CN II exam reveals vision grossly intact. No nystagmus at any point of gaze. Ears: Auricles well formed without lesions. Ear canals are intact without mass or lesion. The right ventilating tube is in place and patent. The left tympanic membrane and middle ear space are normal. Oral:  Oral cavity and oropharynx are intact, symmetric, without erythema or edema. Mucosa is moist without lesions. Neck: Full range of motion without pain. There is no significant lymphadenopathy. No masses palpable. Thyroid bed within normal limits to palpation. Parotid glands and submandibular glands equal bilaterally without mass. Trachea is midline. Neuro:  CN 2-12 grossly intact. Gait normal.   assessment 1.  The right ventilating tube is in place and patent.  No drainage or infection is noted. 2.  The left tympanic membrane and middle ear space are normal. 3.  The right ear conductive hearing loss has subjectively resolved.   4.  Chronic right ear eustachian tube dysfunction.  plan 1.  The physical exam findings are reviewed  with the patient. 2.  Continue with Flonase nasal spray daily. 3.  Dry ear precaution on the right side. 4.  The patient will return for reevaluation in 12 months, sooner if needed.

## 2024-06-01 ENCOUNTER — Encounter (HOSPITAL_COMMUNITY): Payer: Self-pay | Admitting: *Deleted

## 2024-06-01 NOTE — Progress Notes (Addendum)
 Spoke w/ via phone for pre-op interview--- Sarah [name] needs dos---- CBC, CMP and UPT per surgeon.        [name] results------ COVID test -----patient states asymptomatic no test needed Arrive at -------0830 NPO after MN NO Solid Food.  Clear liquids from MN until---0730 Pre-Surgery Ensure or G2:  Med rec completed Medications to take morning of surgery -----Bring Albuterol  inhaler. Klonopin PRN Diabetic medication -----  GLP1 agonist last dose: GLP1 instructions:  Patient instructed no nail polish to be worn day of surgery Patient instructed to bring photo id and insurance card day of surgery Patient aware to have Driver (ride ) / caregiver    for 24 hours after surgery - Husband Sarah [name] Patient Special Instructions ----- Pre-Op special Instructions ----- pt has broken right foot. Surgery 3 weeks ago, wearing ortho shoe.  Patient verbalized understanding of instructions that were given at this phone interview. Patient denies chest pain, sob, fever, cough at the interview.

## 2024-06-05 NOTE — H&P (Signed)
 Chantrell Apsey is an 38 y.o. female G2P2 with undesired fertility for removal of IUD - have become malpositioned on multiple occasions despite placement with Ultrasound guidance.  Plan for DaVinci Robot Assisted Bilateral Salpingectomyy and IUD removal.  D/W pt r/b/a of surgery, also process and expectations  Pertinent Gynecological History: G2P2 - LTCS x 2 No abn pap, last 04/2020 WNL HR HPV neg No STI  Menstrual History:  No LMP recorded (lmp unknown). (Menstrual status: IUD).    Past Medical History:  Diagnosis Date   Anorexia    Anxiety    Asthma    sports related   Chest pain    Heart murmur    Hx of varicella    Partial hearing loss    R ear   S/P cesarean section 01/18/2016    Past Surgical History:  Procedure Laterality Date   CESAREAN SECTION N/A 03/30/2014   Procedure: CESAREAN SECTION;  Surgeon: Krystal Deaner, MD;  Location: [facility];  Service: Obstetrics;  Laterality: N/A;   CESAREAN SECTION N/A 01/18/2016   Procedure: CESAREAN SECTION;  Surgeon: Ezzie Buba, MD;  Location: [facility];  Service: Obstetrics;  Laterality: N/A;   SESAMOIDECTOMY Right 02/03/2019   Procedure: RIGHT MEDIAL SESAMOIDECTOMY;  Surgeon: Elsa Lonni SAUNDERS, MD;  Location: [facility];  Service: Orthopedics;  Laterality: Right;   TONSILLECTOMY     TYMPANOSTOMY TUBE PLACEMENT      Family History  Problem Relation Age of Onset   Hypertension Mother    Hyperlipidemia Mother    Colon polyps Mother    Hypertension Father    Thyroid disease Father    Colon polyps Father    Other Father        prediabetes   Skin cancer Father    Skin cancer Sister    Mental illness Maternal Aunt        x 2   Thyroid disease Paternal Uncle    Kidney cancer Paternal Uncle    Heart attack Paternal Uncle    Heart attack Paternal Uncle 73   Thyroid disease Paternal Grandmother    Heart attack Paternal Grandfather 2       Deceased   Colon cancer Neg Hx    Esophageal cancer Neg Hx     Stomach cancer Neg Hx    Rectal cancer Neg Hx     Social History:  reports that she has never smoked. She has never used smokeless tobacco. She reports that she does not drink alcohol and does not use drugs. 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     Eds: clonazepam, gabapentin, fluicasone inhaer, MVI, rizatriptan, vanlafaxine, zyrtec  Review of Systems  Constitutional: Negative.   HENT: Negative.    Respiratory: Negative.    Gastrointestinal: Negative.   Genitourinary: Negative.   Musculoskeletal: Negative.   Skin: Negative.   Neurological: Negative.   Psychiatric/Behavioral: Negative.      Height 5' 2 (1.575 m), weight 54.4 kg. Physical Exam Constitutional:  Appearance: Normal appearance.  HENT:     Head: Normocephalic.  Cardiovascular:     Rate and Rhythm: Normal rate and regular rhythm.  Pulmonary:     Effort: Pulmonary effort is normal.     Breath sounds: Normal breath sounds.  Abdominal:     General: Bowel sounds are normal.     Palpations: Abdomen is soft.  Genitourinary:    General: Normal vulva.     Rectum: Normal.  Musculoskeletal:        General: Normal range of motion.     Cervical back: Normal range of motion and neck supple.  Skin:    General: Skin is warm and dry.  Neurological:     General: No focal deficit present.     Mental Status: She is alert and oriented to person, place, and time.  Psychiatric:         Mood and Affect: Mood normal.        Behavior: Behavior normal.     Assessment/Plan: 38yo G2P2 for RA BS and removal of paragard IUD D/w pt r/b/a of surgery inc r/b/a, process and expectations Given ibupron and oxycodone  for postop, also made postop appt  Vanecia Limpert [name] 06/05/2024, 1:43 PM

## 2024-06-06 ENCOUNTER — Ambulatory Visit (HOSPITAL_COMMUNITY): Admitting: Anesthesiology

## 2024-06-06 ENCOUNTER — Encounter (HOSPITAL_COMMUNITY)
Admission: RE | Disposition: A | Discharge status: Home / Self Care | Payer: Self-pay | Source: Home / Self Care | Attending: Obstetrics and Gynecology

## 2024-06-06 ENCOUNTER — Other Ambulatory Visit: Payer: Self-pay

## 2024-06-06 ENCOUNTER — Ambulatory Visit (HOSPITAL_COMMUNITY)
Admission: RE | Admit: 2024-06-06 | Discharge: 2024-06-06 | Disposition: A | Discharge status: Home / Self Care | Attending: Obstetrics and Gynecology | Admitting: Obstetrics and Gynecology

## 2024-06-06 ENCOUNTER — Encounter (HOSPITAL_COMMUNITY): Payer: Self-pay | Admitting: Obstetrics and Gynecology

## 2024-06-06 DIAGNOSIS — Z302 Encounter for sterilization: Secondary | ICD-10-CM | POA: Diagnosis present

## 2024-06-06 DIAGNOSIS — T8389XA Other specified complication of genitourinary prosthetic devices, implants and grafts, initial encounter: Secondary | ICD-10-CM | POA: Diagnosis not present

## 2024-06-06 DIAGNOSIS — N838 Other noninflammatory disorders of ovary, fallopian tube and broad ligament: Secondary | ICD-10-CM | POA: Insufficient documentation

## 2024-06-06 DIAGNOSIS — Y768 Miscellaneous obstetric and gynecological devices associated with adverse incidents, not elsewhere classified: Secondary | ICD-10-CM | POA: Diagnosis not present

## 2024-06-06 DIAGNOSIS — J45909 Unspecified asthma, uncomplicated: Secondary | ICD-10-CM | POA: Diagnosis not present

## 2024-06-06 HISTORY — PX: ROBOTIC ASSISTED LAPAROSCOPIC LYSIS OF ADHESION: SHX6080

## 2024-06-06 HISTORY — PX: XI ROBOTIC ASSISTED SALPINGECTOMY: SHX6824

## 2024-06-06 HISTORY — PX: IUD REMOVAL: SHX5392

## 2024-06-06 LAB — CBC
HCT: 39.7 % (ref 36.0–46.0)
Hemoglobin: 13.1 g/dL (ref 12.0–15.0)
MCH: 30.3 pg (ref 26.0–34.0)
MCHC: 33 g/dL (ref 30.0–36.0)
MCV: 91.9 fL (ref 80.0–100.0)
Platelets: 319 K/uL (ref 150–400)
RBC: 4.32 MIL/uL (ref 3.87–5.11)
RDW: 11.9 % (ref 11.5–15.5)
WBC: 4.9 K/uL (ref 4.0–10.5)
nRBC: 0 % (ref 0.0–0.2)

## 2024-06-06 LAB — COMPREHENSIVE METABOLIC PANEL WITH GFR
ALT: 16 U/L (ref 0–44)
AST: 21 U/L (ref 15–41)
Albumin: 4.1 g/dL (ref 3.5–5.0)
Alkaline Phosphatase: 56 U/L (ref 38–126)
Anion gap: 8 (ref 5–15)
BUN: <5 mg/dL — ABNORMAL LOW (ref 6–20)
CO2: 28 mmol/L (ref 22–32)
Calcium: 9.2 mg/dL (ref 8.9–10.3)
Chloride: 103 mmol/L (ref 98–111)
Creatinine, Ser: 0.53 mg/dL (ref 0.44–1.00)
GFR, Estimated: >60 mL/min (ref >60)
Glucose, Bld: 88 mg/dL (ref 70–99)
Potassium: 3.5 mmol/L (ref 3.5–5.1)
Sodium: 139 mmol/L (ref 135–145)
Total Bilirubin: 0.8 mg/dL (ref 0.0–1.2)
Total Protein: 7.3 g/dL (ref 6.5–8.1)

## 2024-06-06 LAB — POCT PREGNANCY, URINE: Preg Test, Ur: NEGATIVE

## 2024-06-06 SURGERY — SALPINGECTOMY, ROBOT-ASSISTED
Anesthesia: General | Site: Vagina

## 2024-06-06 MED ORDER — SCOPOLAMINE 1 MG/3DAYS TD PT72
MEDICATED_PATCH | TRANSDERMAL | Status: AC
  Filled 2024-06-06: qty 1

## 2024-06-06 MED ORDER — ONDANSETRON HCL 4 MG/2ML IJ SOLN
INTRAMUSCULAR | Status: DC | PRN
  Administered 2024-06-06: 4 mg via INTRAVENOUS

## 2024-06-06 MED ORDER — SCOPOLAMINE 1 MG/3DAYS TD PT72
1.0000 | MEDICATED_PATCH | TRANSDERMAL | Status: DC
  Administered 2024-06-06: 1 mg via TRANSDERMAL

## 2024-06-06 MED ORDER — ACETAMINOPHEN 500 MG PO TABS
1000.0000 mg | ORAL_TABLET | ORAL | Status: AC
  Administered 2024-06-06: 1000 mg via ORAL

## 2024-06-06 MED ORDER — PROPOFOL 10 MG/ML IV BOLUS
INTRAVENOUS | Status: DC | PRN
  Administered 2024-06-06: 150 mg via INTRAVENOUS
  Administered 2024-06-06: 150 ug/kg/min via INTRAVENOUS

## 2024-06-06 MED ORDER — SUGAMMADEX SODIUM 200 MG/2ML IV SOLN
INTRAVENOUS | Status: DC | PRN
Start: 2024-06-06 — End: 2024-06-06
  Administered 2024-06-06: 200 mg via INTRAVENOUS

## 2024-06-06 MED ORDER — KETOROLAC TROMETHAMINE 30 MG/ML IJ SOLN
INTRAMUSCULAR | Status: DC | PRN
Start: 2024-06-06 — End: 2024-06-06
  Administered 2024-06-06: 30 mg via INTRAVENOUS

## 2024-06-06 MED ORDER — DEXAMETHASONE SODIUM PHOSPHATE 10 MG/ML IJ SOLN
INTRAMUSCULAR | Status: AC
  Filled 2024-06-06: qty 1

## 2024-06-06 MED ORDER — FENTANYL CITRATE (PF) 250 MCG/5ML IJ SOLN
INTRAMUSCULAR | Status: DC | PRN
  Administered 2024-06-06 (×2): 100 ug via INTRAVENOUS
  Administered 2024-06-06: 50 ug via INTRAVENOUS

## 2024-06-06 MED ORDER — ORAL CARE MOUTH RINSE: 15.0000 mL | Freq: Once | OROMUCOSAL | Status: AC

## 2024-06-06 MED ORDER — ROCURONIUM BROMIDE 10 MG/ML (PF) SYRINGE
PREFILLED_SYRINGE | INTRAVENOUS | Status: AC
  Filled 2024-06-06: qty 10

## 2024-06-06 MED ORDER — HYDRALAZINE HCL 20 MG/ML IJ SOLN
INTRAMUSCULAR | Status: AC
  Filled 2024-06-06: qty 1

## 2024-06-06 MED ORDER — 0.9 % SODIUM CHLORIDE (POUR BTL) OPTIME
TOPICAL | Status: DC | PRN
  Administered 2024-06-06: 1000 mL

## 2024-06-06 MED ORDER — LACTATED RINGERS IV SOLN: INTRAVENOUS | Status: DC

## 2024-06-06 MED ORDER — PHENYLEPHRINE 80 MCG/ML (10ML) SYRINGE FOR IV PUSH (FOR BLOOD PRESSURE SUPPORT)
PREFILLED_SYRINGE | INTRAVENOUS | Status: AC
  Filled 2024-06-06: qty 10

## 2024-06-06 MED ORDER — LIDOCAINE 2% (20 MG/ML) 5 ML SYRINGE
INTRAMUSCULAR | Status: DC | PRN
  Administered 2024-06-06: 100 mg via INTRAVENOUS

## 2024-06-06 MED ORDER — DEXMEDETOMIDINE HCL IN NACL 80 MCG/20ML IV SOLN
INTRAVENOUS | Status: DC | PRN
  Administered 2024-06-06: 10 ug via INTRAVENOUS

## 2024-06-06 MED ORDER — HYDRALAZINE HCL 20 MG/ML IJ SOLN
INTRAMUSCULAR | Status: DC | PRN
Start: 2024-06-06 — End: 2024-06-06
  Administered 2024-06-06: 2 mg via INTRAVENOUS

## 2024-06-06 MED ORDER — MIDAZOLAM HCL 2 MG/2ML IJ SOLN
INTRAMUSCULAR | Status: AC
  Filled 2024-06-06: qty 2

## 2024-06-06 MED ORDER — LIDOCAINE 2% (20 MG/ML) 5 ML SYRINGE
INTRAMUSCULAR | Status: AC
  Filled 2024-06-06: qty 5

## 2024-06-06 MED ORDER — FENTANYL CITRATE (PF) 250 MCG/5ML IJ SOLN
INTRAMUSCULAR | Status: AC
  Filled 2024-06-06: qty 5

## 2024-06-06 MED ORDER — CHLORHEXIDINE GLUCONATE 0.12 % MT SOLN
OROMUCOSAL | Status: AC
  Filled 2024-06-06: qty 15

## 2024-06-06 MED ORDER — GLYCOPYRROLATE PF 0.2 MG/ML IJ SOSY
PREFILLED_SYRINGE | INTRAMUSCULAR | Status: DC | PRN
  Administered 2024-06-06: .2 mg via INTRAVENOUS

## 2024-06-06 MED ORDER — POVIDONE-IODINE 10 % EX SWAB: 2.0000 | Freq: Once | CUTANEOUS | Status: DC

## 2024-06-06 MED ORDER — DEXAMETHASONE SODIUM PHOSPHATE 10 MG/ML IJ SOLN
INTRAMUSCULAR | Status: DC | PRN
  Administered 2024-06-06: 10 mg via INTRAVENOUS

## 2024-06-06 MED ORDER — MIDAZOLAM HCL 2 MG/2ML IJ SOLN
INTRAMUSCULAR | Status: DC | PRN
Start: 2024-06-06 — End: 2024-06-06
  Administered 2024-06-06: 2 mg via INTRAVENOUS

## 2024-06-06 MED ORDER — FENTANYL CITRATE (PF) 100 MCG/2ML IJ SOLN
25.0000 ug | INTRAMUSCULAR | Status: DC | PRN
  Administered 2024-06-06: 25 ug via INTRAVENOUS

## 2024-06-06 MED ORDER — KETOROLAC TROMETHAMINE 30 MG/ML IJ SOLN
INTRAMUSCULAR | Status: AC
  Filled 2024-06-06: qty 1

## 2024-06-06 MED ORDER — AMISULPRIDE (ANTIEMETIC) 5 MG/2ML IV SOLN: 10.0000 mg | Freq: Once | INTRAVENOUS | Status: DC | PRN

## 2024-06-06 MED ORDER — OXYCODONE HCL 5 MG/5ML PO SOLN: 5.0000 mg | Freq: Once | ORAL | Status: AC | PRN

## 2024-06-06 MED ORDER — CHLORHEXIDINE GLUCONATE 0.12 % MT SOLN
15.0000 mL | Freq: Once | OROMUCOSAL | Status: AC
  Administered 2024-06-06: 15 mL via OROMUCOSAL

## 2024-06-06 MED ORDER — ACETAMINOPHEN 500 MG PO TABS
ORAL_TABLET | ORAL | Status: AC
  Filled 2024-06-06: qty 2

## 2024-06-06 MED ORDER — OXYCODONE HCL 5 MG PO TABS
5.0000 mg | ORAL_TABLET | Freq: Once | ORAL | Status: AC | PRN
  Administered 2024-06-06: 5 mg via ORAL

## 2024-06-06 MED ORDER — ROCURONIUM BROMIDE 10 MG/ML (PF) SYRINGE
PREFILLED_SYRINGE | INTRAVENOUS | Status: DC | PRN
  Administered 2024-06-06: 50 mg via INTRAVENOUS
  Administered 2024-06-06: 20 mg via INTRAVENOUS

## 2024-06-06 MED ORDER — ONDANSETRON HCL 4 MG/2ML IJ SOLN
INTRAMUSCULAR | Status: AC
  Filled 2024-06-06: qty 2

## 2024-06-06 MED ORDER — FENTANYL CITRATE (PF) 100 MCG/2ML IJ SOLN
INTRAMUSCULAR | Status: AC
  Filled 2024-06-06: qty 2

## 2024-06-06 MED ORDER — BUPIVACAINE HCL (PF) 0.25 % IJ SOLN
INTRAMUSCULAR | Status: DC | PRN
  Administered 2024-06-06: 12 mL

## 2024-06-06 MED ORDER — PROPOFOL 10 MG/ML IV BOLUS
INTRAVENOUS | Status: AC
  Filled 2024-06-06: qty 20

## 2024-06-06 MED ORDER — OXYCODONE HCL 5 MG PO TABS
ORAL_TABLET | ORAL | Status: AC
  Filled 2024-06-06: qty 1

## 2024-06-06 MED ORDER — GLYCOPYRROLATE PF 0.2 MG/ML IJ SOSY
PREFILLED_SYRINGE | INTRAMUSCULAR | Status: AC
  Filled 2024-06-06: qty 1

## 2024-06-06 SURGICAL SUPPLY — 64 items
APPLICATOR ARISTA FLEXITIP XL (MISCELLANEOUS) IMPLANT
CANNULA CAP OBTURATR AIRSEAL 8 (CAP) ×3 IMPLANT
CANNULA REDUCER 12-8 DVNC XI (CANNULA) IMPLANT
CATH FOLEY 3WAY 5CC 16FR (CATHETERS) ×3 IMPLANT
CATH ROBINSON RED A/P 16FR (CATHETERS) ×3 IMPLANT
COVER BACK TABLE 60X90IN (DRAPES) ×3 IMPLANT
COVER MAYO STAND STRL (DRAPES) ×3 IMPLANT
COVER TIP SHEARS 8 DVNC (MISCELLANEOUS) ×3 IMPLANT
DEFOGGER SCOPE WARM SEASHARP (MISCELLANEOUS) ×3 IMPLANT
DERMABOND ADVANCED .7 DNX12 (GAUZE/BANDAGES/DRESSINGS) ×3 IMPLANT
DRAPE ARM DVNC X/XI (DISPOSABLE) ×12 IMPLANT
DRAPE COLUMN DVNC XI (DISPOSABLE) ×3 IMPLANT
DRAPE SURG IRRIG POUCH 19X23 (DRAPES) ×6 IMPLANT
DRAPE UTILITY XL STRL (DRAPES) ×3 IMPLANT
DRIVER NDL MEGA SUTCUT DVNCXI (INSTRUMENTS) ×3 IMPLANT
DRIVER NDLE MEGA SUTCUT DVNCXI (INSTRUMENTS) ×3 IMPLANT
DRSG TELFA 3X8 NADH STRL (GAUZE/BANDAGES/DRESSINGS) ×3 IMPLANT
DURAPREP 26ML APPLICATOR (WOUND CARE) ×3 IMPLANT
ELECTRODE REM PT RTRN 9FT ADLT (ELECTROSURGICAL) ×3 IMPLANT
FORCEPS BPLR FENES DVNC XI (FORCEP) ×3 IMPLANT
FORCEPS PROGRASP DVNC XI (FORCEP) IMPLANT
GAUZE 4X4 16PLY ~~LOC~~+RFID DBL (SPONGE) IMPLANT
GLOVE BIO SURGEON STRL SZ 6.5 (GLOVE) ×9 IMPLANT
GLOVE BIOGEL PI IND STRL 7.0 (GLOVE) ×3 IMPLANT
GLOVE SURG SS PI 6.5 STRL IVOR (GLOVE) IMPLANT
GOWN STRL REUS W/TWL LRG LVL3 (GOWN DISPOSABLE) ×3 IMPLANT
HEMOSTAT ARISTA ABSORB 3G PWDR (HEMOSTASIS) IMPLANT
HOLDER FOLEY CATH W/STRAP (MISCELLANEOUS) IMPLANT
IRRIGATION SUCT STRKRFLW 2 WTP (MISCELLANEOUS) ×3 IMPLANT
KIT PINK PAD W/HEAD ARM REST (MISCELLANEOUS) ×3 IMPLANT
KIT TURNOVER KIT B (KITS) ×3 IMPLANT
LEGGING LITHOTOMY PAIR STRL (DRAPES) ×3 IMPLANT
MANIFOLD NEPTUNE II (INSTRUMENTS) ×3 IMPLANT
MANIPULATOR ADVINCU DEL 2.5 PL (MISCELLANEOUS) IMPLANT
MANIPULATOR ADVINCU DEL 3.0 PL (MISCELLANEOUS) IMPLANT
MANIPULATOR ADVINCU DEL 3.5 PL (MISCELLANEOUS) IMPLANT
MANIPULATOR ADVINCU DEL 4.0 PL (MISCELLANEOUS) IMPLANT
NDL INSUFFLATION 14GA 120MM (NEEDLE) ×3 IMPLANT
NEEDLE INSUFFLATION 14GA 120MM (NEEDLE) ×3 IMPLANT
NS IRRIG 1000ML POUR BTL (IV SOLUTION) IMPLANT
OBTURATOR OPTICALSTD 8 DVNC (TROCAR) ×3 IMPLANT
OCCLUDER COLPOPNEUMO (BALLOONS) IMPLANT
PACK ROBOT WH (CUSTOM PROCEDURE TRAY) ×3 IMPLANT
PACK ROBOTIC GOWN (GOWN DISPOSABLE) ×3 IMPLANT
PACK VAGINAL MINOR WOMEN LF (CUSTOM PROCEDURE TRAY) ×3 IMPLANT
PAD OB MATERNITY 11 LF (PERSONAL CARE ITEMS) ×3 IMPLANT
PAD PREP 24X48 CUFFED NSTRL (MISCELLANEOUS) ×3 IMPLANT
SAVER CELL AAL HAEMONETICS (INSTRUMENTS) IMPLANT
SCISSORS MNPLR CVD DVNC XI (INSTRUMENTS) ×3 IMPLANT
SEAL UNIV 5-12 XI (MISCELLANEOUS) ×6 IMPLANT
SEALER VESSEL EXT DVNC XI (MISCELLANEOUS) IMPLANT
SET IRRIG Y TYPE TUR BLADDER L (SET/KITS/TRAYS/PACK) IMPLANT
SET TUBE FILTERED XL AIRSEAL (SET/KITS/TRAYS/PACK) ×3 IMPLANT
SLEEVE SCD COMPRESS KNEE MED (STOCKING) ×3 IMPLANT
SPIKE FLUID TRANSFER (MISCELLANEOUS) ×6 IMPLANT
SUT VIC AB 0 CT1 27XBRD ANBCTR (SUTURE) IMPLANT
SUT VIC AB 4-0 PS2 18 (SUTURE) ×3 IMPLANT
SUT VIC AB 4-0 PS2 27 (SUTURE) IMPLANT
SUT VICRYL 0 UR6 27IN ABS (SUTURE) IMPLANT
SUT VLOC 180 0 9IN GS21 (SUTURE) ×3 IMPLANT
TOWEL GREEN STERILE (TOWEL DISPOSABLE) ×6 IMPLANT
TROCAR PORT AIRSEAL 8X100 (TROCAR) IMPLANT
UNDERPAD 30X36 HEAVY ABSORB (UNDERPADS AND DIAPERS) ×3 IMPLANT
WATER STERILE IRR 1000ML POUR (IV SOLUTION) ×3 IMPLANT

## 2024-06-06 NOTE — Transfer of Care (Signed)
 Immediate Anesthesia Transfer of Care Note  Patient: Sarah [name]  Procedure(s) Performed: SALPINGECTOMY, ROBOT-ASSISTED (Bilateral: Pelvis) REMOVAL, INTRAUTERINE DEVICE (Vagina ) LYSIS, ADHESIONS, ROBOT-ASSISTED, LAPAROSCOPIC (Pelvis)  Patient Location: PACU  Anesthesia Type:General  Level of Consciousness: awake, alert , oriented, and patient cooperative  Airway & Oxygen Therapy: Patient Spontanous Breathing and Patient connected to nasal cannula oxygen  Post-op Assessment: Report given to RN, Post -op Vital signs reviewed and stable, Patient moving all extremities X 4, and Patient able to stick tongue midline  Post vital signs: Reviewed and stable  Last Vitals:  Vitals Value Taken Time  BP 101/69 06/06/24 11:30  Temp 36.4 C 06/06/24 11:29  Pulse 69 06/06/24 11:33  Resp 6 06/06/24 11:33  SpO2 99 % 06/06/24 11:33  Vitals shown include unfiled device data.  Last Pain:  Vitals:   06/06/24 0842  TempSrc: Oral  PainSc: 0-No pain      Patients Stated Pain Goal: 4 (06/06/24 9157)  Complications: No notable events documented.

## 2024-06-06 NOTE — Discharge Instructions (Signed)

## 2024-06-06 NOTE — Anesthesia Preprocedure Evaluation (Addendum)
 Anesthesia Evaluation  Patient identified by MRN, date of birth, ID band Patient awake    Reviewed: Allergy & Precautions, NPO status , Patient's Chart, lab work & pertinent test results  Airway Mallampati: I  TM Distance: >3 FB Neck ROM: Full    Dental no notable dental hx. (+) Teeth Intact, Dental Advisory Given   Pulmonary asthma    Pulmonary exam normal breath sounds clear to auscultation       Cardiovascular negative cardio ROS Normal cardiovascular exam Rhythm:Regular Rate:Normal     Neuro/Psych  PSYCHIATRIC DISORDERS Anxiety     negative neurological ROS     GI/Hepatic negative GI ROS, Neg liver ROS,,,  Endo/Other  negative endocrine ROS    Renal/GU negative Renal ROS  negative genitourinary   Musculoskeletal negative musculoskeletal ROS (+)    Abdominal   Peds  Hematology negative hematology ROS (+)   Anesthesia Other Findings   Reproductive/Obstetrics                              Anesthesia Physical Anesthesia Plan  ASA: 2  Anesthesia Plan: General   Post-op Pain Management: Tylenol  PO (pre-op)*   Induction: Intravenous  PONV Risk Score and Plan: 3 and Midazolam , Dexamethasone  and Ondansetron   Airway Management Planned: Oral ETT  Additional Equipment:   Intra-op Plan:   Post-operative Plan: Extubation in OR  Informed Consent: I have reviewed the patients History and Physical, chart, labs and discussed the procedure including the risks, benefits and alternatives for the proposed anesthesia with the patient or authorized representative who has indicated his/her understanding and acceptance.     Dental advisory given  Plan Discussed with: CRNA  Anesthesia Plan Comments:          Anesthesia Quick Evaluation

## 2024-06-06 NOTE — Anesthesia Procedure Notes (Signed)
 Procedure Name: Intubation Date/Time: 06/06/2024 10:09 AM  Performed by: Viviana Almarie DASEN, CRNAPre-anesthesia Checklist: Patient identified, Emergency Drugs available, Suction available and Patient being monitored Patient Re-evaluated:Patient Re-evaluated prior to induction Oxygen Delivery Method: Circle System Utilized Preoxygenation: Pre-oxygenation with 100% oxygen Induction Type: IV induction Ventilation: Mask ventilation without difficulty Laryngoscope Size: Mac and 3 Grade View: Grade I Tube type: Oral Tube size: 6.5 mm Number of attempts: 1 Airway Equipment and Method: Stylet and Bite block Placement Confirmation: ETT inserted through vocal cords under direct vision, positive ETCO2 and breath sounds checked- equal and bilateral Secured at: 21 cm Tube secured with: Tape Dental Injury: Teeth and Oropharynx as per pre-operative assessment

## 2024-06-06 NOTE — Discharge Instr - Supplementary Instructions (Signed)
 May take Tylenol  after 3pm if needed for discomfort.  May take Ibuprofen ,Advil ,or Aleve after 5pm if needed for discomfort.

## 2024-06-06 NOTE — Anesthesia Postprocedure Evaluation (Signed)
 Anesthesia Post Note  Patient: Girlie Veltri  Procedure(s) Performed: SALPINGECTOMY, ROBOT-ASSISTED (Bilateral: Pelvis) REMOVAL, INTRAUTERINE DEVICE (Vagina ) LYSIS, ADHESIONS, ROBOT-ASSISTED, LAPAROSCOPIC (Pelvis)     Patient location during evaluation: PACU Anesthesia Type: General Level of consciousness: awake and alert Pain management: pain level controlled Vital Signs Assessment: post-procedure vital signs reviewed and stable Respiratory status: spontaneous breathing, nonlabored ventilation, respiratory function stable and patient connected to nasal cannula oxygen Cardiovascular status: blood pressure returned to baseline and stable Postop Assessment: no apparent nausea or vomiting Anesthetic complications: no   No notable events documented.  Last Vitals:  Vitals:   06/06/24 1228 06/06/24 1230  BP:  107/74  Pulse: 84 78  Resp: 14 12  Temp: 36.6 C   SpO2: 98% 98%    Last Pain:  Vitals:   06/06/24 1234  TempSrc:   PainSc: 4                  Daira Hine L Mandela Bello

## 2024-06-06 NOTE — Op Note (Unsigned)
 NAMEELECTRA, PALADINO MEDICAL RECORD NO: [id number] ACCOUNT NO: [id number] DATE OF BIRTH: 05-Aug-1986 FACILITY: [facility] LOCATION: [facility] PHYSICIAN: Ezzie Buba, MD  Operative Report   DATE OF PROCEDURE: 06/06/2024  PREOPERATIVE DIAGNOSIS: Undesired fertility with an IUD that is malpositioned.  POSTOPERATIVE DIAGNOSIS: Undesired fertility with an IUD that is malpositioned.  PROCEDURE: Removal of ParaGard IUD, robot-assisted bilateral salpingectomy, robot-assisted lysis of adhesions.  SURGEON: Ezzie Buba, MD.  ASSISTANTBETHA Powell Hoops, RNFA.  ANESTHESIA: Local and general.  URINE OUTPUT: Per anesthesia.  ESTIMATED BLOOD LOSS: Approximately 10 mL.  COMPLICATIONS: None.  DESCRIPTION OF PROCEDURE: After informed consent was reviewed with the patient and her husband, she was transported to the operating room and placed on the table in the supine position. General anesthesia was induced and found to be adequate. She was  then prepped and draped in the normal sterile fashion. After an appropriate time-out was performed, a Foley catheter was placed in her bladder and a Hulka manipulator was placed in her uterus. Strings were visualized and grasped and IUD was rein its entirety.  Gloves were changed. Attention was turned to the abdominal  portion of the case. An approximately 8 mm supraumbilical incision was made. The fascia was cleaned up with a hemostat. A Veress needle was placed after passing the hanging drop test with an opening pressure of 1 mmHg. Pneumoperitoneum was obtained. It  was attempted to place a trocar under direct visualization. This was unsuccessful. Therefore, the incision was minimally enlarged and a Hassan trocar was placed. The bowel was carefully inspected to make sure that no injury had occurred. The accessory  ports were placed on the right and left sides under direct visualization. Using bipolar and hot scissors, the adhesions were lysed. They were  omental to the anterior abdominal wall. The tubes were both identified. The left tube was grasped at the  fimbriated end and excised to the level of the cornua using the hot scissors, held in the anterior cul-de-sac. Attention was then turned to the right tube, which in a similar fashion was grasped at the fimbriated end, excised to the level of the cornua.  The tubes were removed through the trocar. Hemostasis was assured. Pneumoperitoneum was removed. Sponge, lap and needle counts were correct x2 per the operating staff. The trocar incisions were closed with subcuticular incisions on the accessory ports and  a suture had been placed in the umbilical incision. The fascia was palpated and felt to be intact. The subcuticular sutures and Dermabond were applied to all 3 sites. The Hulka manipulator was removed. The patient tolerated the procedure well.    CHR D: 06/06/2024 11:15:15 am T: 06/06/2024 11:59:00 am  JOB: [id number]/ [id number]

## 2024-06-06 NOTE — Brief Op Note (Signed)
 06/06/2024  11:08 AM  PATIENT:  Damien Corrente  38 y.o. female  PRE-OPERATIVE DIAGNOSIS:  sterilization  POST-OPERATIVE DIAGNOSIS:  sterilization  PROCEDURE:  Procedure(s): SALPINGECTOMY, ROBOT-ASSISTED (Bilateral) REMOVAL, INTRAUTERINE DEVICE (N/A) LYSIS, ADHESIONS, ROBOT-ASSISTED, LAPAROSCOPIC (N/A)  SURGEON:  Surgeons and Role:    * [name], Ezzie, MD - Primary  ASSISTANTS: Gwenith Moats RNFA   ANESTHESIA:   local and general  EBL:  10 mL IVF and uop per anesthesia  DRAINS: none   LOCAL MEDICATIONS USED:  MARCAINE      SPECIMEN:  Source of Specimen:  B fallopian tubes, tie on R  DISPOSITION OF SPECIMEN:  PATHOLOGY  COUNTS:  YES  TOURNIQUET:  * No tourniquets in log *  DICTATION: .Other Dictation: Dictation Number [id number]  PLAN OF CARE: Discharge to home after PACU  PATIENT DISPOSITION:  PACU - hemodynamically stable.   Delay start of Pharmacological VTE agent (>24hrs) due to surgical blood loss or risk of bleeding: not applicable

## 2024-06-06 NOTE — Interval H&P Note (Signed)
 History and Physical Interval Note:  06/06/2024 9:12 AM  Sarah [name]  has presented today for surgery, with the diagnosis of sterilization.  The various methods of treatment have been discussed with the patient and family. After consideration of risks, benefits and other options for treatment, the patient has consented to  Procedure(s): SALPINGECTOMY, ROBOT-ASSISTED (Bilateral) REMOVAL, INTRAUTERINE DEVICE (N/A) as a surgical intervention.  The patient's history has been reviewed, patient examined, no change in status, stable for surgery.  I have reviewed the patient's chart and labs.  Questions were answered to the patient's satisfaction.     Trigg Delarocha [name]

## 2024-06-07 ENCOUNTER — Encounter (HOSPITAL_COMMUNITY): Payer: Self-pay | Admitting: Obstetrics and Gynecology

## 2024-06-07 LAB — SURGICAL PATHOLOGY

## 2024-10-04 ENCOUNTER — Ambulatory Visit (INDEPENDENT_AMBULATORY_CARE_PROVIDER_SITE_OTHER): Admitting: Otolaryngology

## 2024-10-04 ENCOUNTER — Encounter (INDEPENDENT_AMBULATORY_CARE_PROVIDER_SITE_OTHER): Payer: Self-pay | Admitting: Otolaryngology

## 2024-10-04 VITALS — BP 110/76 | HR 69 | Temp 98.1°F

## 2024-10-04 DIAGNOSIS — H6981 Other specified disorders of Eustachian tube, right ear: Secondary | ICD-10-CM

## 2024-10-04 DIAGNOSIS — H6121 Impacted cerumen, right ear: Secondary | ICD-10-CM

## 2024-10-04 DIAGNOSIS — J310 Chronic rhinitis: Secondary | ICD-10-CM

## 2024-10-04 NOTE — Progress Notes (Unsigned)
 Patient ID: Sarah [name], female   DOB: Feb 05, 1986, 39 y.o.   MRN: [id number]  Follow up: Right ear eustachian tube dysfunction, chronic rhinitis  HPI: The patient is a 39 year old female who returns today for her follow-up evaluation.  The patient was previously seen for her right ear eustachian tube dysfunction and right middle ear effusion.  She underwent multiple right myringotomy and tube placement procedures.  At her last visit in October 2024, her right tube was in place and patent.  Her nasal congestion was controlled with Flonase nasal spray.  The patient returns today reporting intermittent right ear clogging sensation.  She denies any significant otalgia, otorrhea, or change in her hearing.  Exam: General: Communicates without difficulty, well nourished, no acute distress. Head: Normocephalic, no evidence injury, no tenderness, facial buttresses intact without stepoff. Face/sinus: No tenderness to palpation and percussion. Facial movement is normal and symmetric. Eyes: PERRL, EOMI. No scleral icterus, conjunctivae clear. Neuro: CN II exam reveals vision grossly intact.  No nystagmus at any point of gaze. Ears: Auricles well formed without lesions.  Right ear cerumen impaction.  The right ventilating tube has extruded into the ear canal, and is encased within the cerumen.  The left ear canal and tympanic membrane are normal.  Nose: External evaluation reveals normal support and skin without lesions.  Dorsum is intact.  Anterior rhinoscopy reveals congested mucosa over anterior aspect of inferior turbinates and intact septum.  No purulence noted. Oral:  Oral cavity and oropharynx are intact, symmetric, without erythema or edema.  Mucosa is moist without lesions. Neck: Full range of motion without pain.  There is no significant lymphadenopathy.  No masses palpable.  Thyroid bed within normal limits to palpation.  Parotid glands and submandibular glands equal bilaterally without mass.  Trachea is  midline. Neuro:  CN 2-12 grossly intact.    Procedure: Right ear cerumen disimpaction Anesthesia: None Description: Under the operating microscope, the cerumen is carefully removed with a combination of cerumen currette, alligator forceps, and suction catheters.  The extruded tube is also removed.  After the cerumen is removed, the TMs are noted to be normal.  No mass, erythema, or lesions. The patient tolerated the procedure well.    Assessment: 1.  Right ear cerumen impaction.  The right tube has extruded into the ear canal, and is encased within the cerumen. 2.  Both tympanic membranes are intact and mobile.  No middle ear effusion is noted. 3.  Chronic right ear eustachian tube dysfunction. 4.  Chronic rhinitis with nasal mucosal congestion.  Plan: 1.  Otomicroscopy with right ear cerumen disimpaction. 2.  The physical exam findings are reviewed with the patient. 3.  Continue with Flonase nasal spray 2 sprays each nostril daily. 4.  The patient will return for reevaluation if her symptoms worsen.

## 2024-10-05 DIAGNOSIS — H6121 Impacted cerumen, right ear: Secondary | ICD-10-CM | POA: Insufficient documentation

## 2024-10-05 DIAGNOSIS — J310 Chronic rhinitis: Secondary | ICD-10-CM | POA: Insufficient documentation
